# Patient Record
Sex: Female | Born: 1945 | Race: Black or African American | Hispanic: No | Marital: Single | Smoking: Former smoker
Health system: Southern US, Community
[De-identification: ages and names within clinical notes are randomized; demographics above are authoritative.]

## PROBLEM LIST (undated history)

## (undated) DIAGNOSIS — I82409 Acute embolism and thrombosis of unspecified deep veins of unspecified lower extremity: Secondary | ICD-10-CM

## (undated) DIAGNOSIS — C189 Malignant neoplasm of colon, unspecified: Secondary | ICD-10-CM

## (undated) DIAGNOSIS — C801 Malignant (primary) neoplasm, unspecified: Secondary | ICD-10-CM

## (undated) DIAGNOSIS — I10 Essential (primary) hypertension: Secondary | ICD-10-CM

---

## 2007-02-13 HISTORY — PX: OTHER SURGICAL HISTORY: SHX169

## 2014-05-08 ENCOUNTER — Emergency Department (HOSPITAL_COMMUNITY): Payer: Medicare Other

## 2014-05-08 ENCOUNTER — Encounter (HOSPITAL_COMMUNITY): Payer: Self-pay | Admitting: Oncology

## 2014-05-08 ENCOUNTER — Inpatient Hospital Stay (HOSPITAL_COMMUNITY)
Admission: EM | Admit: 2014-05-08 | Discharge: 2014-05-14 | DRG: 175 | Disposition: A | Discharge status: Home / Self Care | Payer: Medicare Other | Attending: Internal Medicine | Admitting: Internal Medicine

## 2014-05-08 DIAGNOSIS — Z87891 Personal history of nicotine dependence: Secondary | ICD-10-CM | POA: Diagnosis not present

## 2014-05-08 DIAGNOSIS — Z86711 Personal history of pulmonary embolism: Secondary | ICD-10-CM | POA: Diagnosis not present

## 2014-05-08 DIAGNOSIS — I2699 Other pulmonary embolism without acute cor pulmonale: Secondary | ICD-10-CM | POA: Diagnosis not present

## 2014-05-08 DIAGNOSIS — Z85118 Personal history of other malignant neoplasm of bronchus and lung: Secondary | ICD-10-CM | POA: Diagnosis not present

## 2014-05-08 DIAGNOSIS — E860 Dehydration: Secondary | ICD-10-CM | POA: Diagnosis present

## 2014-05-08 DIAGNOSIS — R079 Chest pain, unspecified: Secondary | ICD-10-CM

## 2014-05-08 DIAGNOSIS — D72829 Elevated white blood cell count, unspecified: Secondary | ICD-10-CM | POA: Diagnosis present

## 2014-05-08 DIAGNOSIS — N179 Acute kidney failure, unspecified: Secondary | ICD-10-CM | POA: Diagnosis present

## 2014-05-08 DIAGNOSIS — J9621 Acute and chronic respiratory failure with hypoxia: Secondary | ICD-10-CM | POA: Diagnosis not present

## 2014-05-08 DIAGNOSIS — Z85038 Personal history of other malignant neoplasm of large intestine: Secondary | ICD-10-CM | POA: Diagnosis not present

## 2014-05-08 DIAGNOSIS — R0902 Hypoxemia: Secondary | ICD-10-CM

## 2014-05-08 DIAGNOSIS — N189 Chronic kidney disease, unspecified: Secondary | ICD-10-CM | POA: Diagnosis present

## 2014-05-08 DIAGNOSIS — I82402 Acute embolism and thrombosis of unspecified deep veins of left lower extremity: Secondary | ICD-10-CM | POA: Diagnosis present

## 2014-05-08 DIAGNOSIS — E8770 Fluid overload, unspecified: Secondary | ICD-10-CM | POA: Diagnosis present

## 2014-05-08 DIAGNOSIS — Z86718 Personal history of other venous thrombosis and embolism: Secondary | ICD-10-CM | POA: Diagnosis not present

## 2014-05-08 DIAGNOSIS — I129 Hypertensive chronic kidney disease with stage 1 through stage 4 chronic kidney disease, or unspecified chronic kidney disease: Secondary | ICD-10-CM | POA: Diagnosis present

## 2014-05-08 DIAGNOSIS — R0789 Other chest pain: Secondary | ICD-10-CM

## 2014-05-08 DIAGNOSIS — D6959 Other secondary thrombocytopenia: Secondary | ICD-10-CM | POA: Diagnosis present

## 2014-05-08 DIAGNOSIS — G8929 Other chronic pain: Secondary | ICD-10-CM

## 2014-05-08 DIAGNOSIS — J189 Pneumonia, unspecified organism: Secondary | ICD-10-CM | POA: Diagnosis present

## 2014-05-08 HISTORY — DX: Acute embolism and thrombosis of unspecified deep veins of unspecified lower extremity: I82.409

## 2014-05-08 HISTORY — DX: Malignant (primary) neoplasm, unspecified: C80.1

## 2014-05-08 HISTORY — DX: Malignant neoplasm of colon, unspecified: C18.9

## 2014-05-08 HISTORY — DX: Essential (primary) hypertension: I10

## 2014-05-08 LAB — CBC WITH DIFFERENTIAL/PLATELET
BASOS ABS: 0 10*3/uL (ref 0.0–0.1)
Basophils Relative: 0 % (ref 0–1)
EOS ABS: 0 10*3/uL (ref 0.0–0.7)
EOS PCT: 0 % (ref 0–5)
HCT: 42 % (ref 36.0–46.0)
Hemoglobin: 13.5 g/dL (ref 12.0–15.0)
LYMPHS PCT: 7 % — AB (ref 12–46)
Lymphs Abs: 1.3 10*3/uL (ref 0.7–4.0)
MCH: 23.8 pg — ABNORMAL LOW (ref 26.0–34.0)
MCHC: 32.1 g/dL (ref 30.0–36.0)
MCV: 74.1 fL — ABNORMAL LOW (ref 78.0–100.0)
Monocytes Absolute: 0.9 10*3/uL (ref 0.1–1.0)
Monocytes Relative: 5 % (ref 3–12)
NEUTROS ABS: 15.9 10*3/uL — AB (ref 1.7–7.7)
Neutrophils Relative %: 88 % — ABNORMAL HIGH (ref 43–77)
PLATELETS: 153 10*3/uL (ref 150–400)
RBC: 5.67 MIL/uL — ABNORMAL HIGH (ref 3.87–5.11)
RDW: 16.1 % — AB (ref 11.5–15.5)
WBC: 18 10*3/uL — ABNORMAL HIGH (ref 4.0–10.5)

## 2014-05-08 LAB — BLOOD GAS, ARTERIAL
Acid-base deficit: 0.4 mmol/L (ref 0.0–2.0)
BICARBONATE: 24.1 meq/L — AB (ref 20.0–24.0)
Drawn by: 31814
FIO2: 1 %
O2 Saturation: 97.3 %
PO2 ART: 108 mmHg — AB (ref 80.0–100.0)
Patient temperature: 98.3
TCO2: 21.7 mmol/L (ref 0–100)
pCO2 arterial: 41.1 mmHg (ref 35.0–45.0)
pH, Arterial: 7.386 (ref 7.350–7.450)

## 2014-05-08 LAB — I-STAT TROPONIN, ED: TROPONIN I, POC: 0 ng/mL (ref 0.00–0.08)

## 2014-05-08 LAB — COMPREHENSIVE METABOLIC PANEL
ALT: 31 U/L (ref 0–35)
AST: 44 U/L — ABNORMAL HIGH (ref 0–37)
Albumin: 3.5 g/dL (ref 3.5–5.2)
Alkaline Phosphatase: 81 U/L (ref 39–117)
Anion gap: 14 (ref 5–15)
BUN: 30 mg/dL — ABNORMAL HIGH (ref 6–23)
CALCIUM: 9.2 mg/dL (ref 8.4–10.5)
CHLORIDE: 104 mmol/L (ref 96–112)
CO2: 21 mmol/L (ref 19–32)
Creatinine, Ser: 1.57 mg/dL — ABNORMAL HIGH (ref 0.50–1.10)
GFR calc non Af Amer: 32 mL/min — ABNORMAL LOW (ref >90)
GFR, EST AFRICAN AMERICAN: 37 mL/min — AB (ref >90)
GLUCOSE: 104 mg/dL — AB (ref 70–99)
POTASSIUM: 4.4 mmol/L (ref 3.5–5.1)
SODIUM: 139 mmol/L (ref 135–145)
Total Bilirubin: 2.5 mg/dL — ABNORMAL HIGH (ref 0.3–1.2)
Total Protein: 7.4 g/dL (ref 6.0–8.3)

## 2014-05-08 LAB — BRAIN NATRIURETIC PEPTIDE: B Natriuretic Peptide: 101.7 pg/mL — ABNORMAL HIGH (ref 0.0–100.0)

## 2014-05-08 MED ORDER — PANTOPRAZOLE SODIUM 40 MG PO TBEC
40.0000 mg | DELAYED_RELEASE_TABLET | Freq: Every day | ORAL | Status: DC
  Administered 2014-05-09 – 2014-05-14 (×6): 40 mg via ORAL
  Filled 2014-05-08 (×6): qty 1

## 2014-05-08 MED ORDER — ACETAMINOPHEN 500 MG PO TABS
1000.0000 mg | ORAL_TABLET | Freq: Four times a day (QID) | ORAL | Status: DC | PRN
  Administered 2014-05-09: 1000 mg via ORAL
  Filled 2014-05-08: qty 2

## 2014-05-08 MED ORDER — ACETAMINOPHEN-CODEINE #4 300-60 MG PO TABS: 1.0000 | ORAL_TABLET | ORAL | Status: DC | PRN

## 2014-05-08 MED ORDER — TIOTROPIUM BROMIDE MONOHYDRATE 18 MCG IN CAPS
18.0000 ug | ORAL_CAPSULE | Freq: Every day | RESPIRATORY_TRACT | Status: DC
  Administered 2014-05-09 – 2014-05-13 (×5): 18 ug via RESPIRATORY_TRACT
  Filled 2014-05-08: qty 5

## 2014-05-08 MED ORDER — AZITHROMYCIN 500 MG PO TABS
500.0000 mg | ORAL_TABLET | ORAL | Status: DC
  Administered 2014-05-09 – 2014-05-13 (×5): 500 mg via ORAL
  Filled 2014-05-08 (×6): qty 1

## 2014-05-08 MED ORDER — DEXTROSE 5 % IV SOLN
1.0000 g | Freq: Once | INTRAVENOUS | Status: AC
  Administered 2014-05-08: 1 g via INTRAVENOUS
  Filled 2014-05-08: qty 10

## 2014-05-08 MED ORDER — METOPROLOL TARTRATE 25 MG PO TABS: 25.0000 mg | ORAL_TABLET | Freq: Every day | ORAL | Status: DC

## 2014-05-08 MED ORDER — CYCLOSPORINE 0.05 % OP EMUL
1.0000 [drp] | Freq: Two times a day (BID) | OPHTHALMIC | Status: DC
  Administered 2014-05-09 – 2014-05-14 (×12): 1 [drp] via OPHTHALMIC
  Filled 2014-05-08 (×14): qty 1

## 2014-05-08 MED ORDER — PREGABALIN 75 MG PO CAPS
300.0000 mg | ORAL_CAPSULE | Freq: Every day | ORAL | Status: DC
  Administered 2014-05-09 – 2014-05-13 (×6): 300 mg via ORAL
  Filled 2014-05-08: qty 6
  Filled 2014-05-08 (×5): qty 4

## 2014-05-08 MED ORDER — SIMETHICONE 80 MG PO CHEW
80.0000 mg | CHEWABLE_TABLET | Freq: Four times a day (QID) | ORAL | Status: DC | PRN
  Filled 2014-05-08: qty 1

## 2014-05-08 MED ORDER — HEPARIN BOLUS VIA INFUSION
2500.0000 [IU] | Freq: Once | INTRAVENOUS | Status: AC
  Administered 2014-05-08: 2500 [IU] via INTRAVENOUS
  Filled 2014-05-08: qty 2500

## 2014-05-08 MED ORDER — IOHEXOL 350 MG/ML SOLN
100.0000 mL | Freq: Once | INTRAVENOUS | Status: AC | PRN
  Administered 2014-05-08: 100 mL via INTRAVENOUS

## 2014-05-08 MED ORDER — ALBUTEROL SULFATE HFA 108 (90 BASE) MCG/ACT IN AERS: 2.0000 | INHALATION_SPRAY | Freq: Four times a day (QID) | RESPIRATORY_TRACT | Status: DC | PRN

## 2014-05-08 MED ORDER — MOMETASONE FURO-FORMOTEROL FUM 100-5 MCG/ACT IN AERO
2.0000 | INHALATION_SPRAY | Freq: Two times a day (BID) | RESPIRATORY_TRACT | Status: DC
  Administered 2014-05-09 – 2014-05-14 (×12): 2 via RESPIRATORY_TRACT
  Filled 2014-05-08: qty 8.8

## 2014-05-08 MED ORDER — DEXTROSE 5 % IV SOLN
500.0000 mg | Freq: Once | INTRAVENOUS | Status: AC
  Administered 2014-05-09: 500 mg via INTRAVENOUS
  Filled 2014-05-08: qty 500

## 2014-05-08 MED ORDER — ADULT MULTIVITAMIN W/MINERALS CH
1.0000 | ORAL_TABLET | Freq: Every day | ORAL | Status: DC
  Administered 2014-05-09 – 2014-05-14 (×7): 1 via ORAL
  Filled 2014-05-08 (×7): qty 1

## 2014-05-08 MED ORDER — HYDROXYCHLOROQUINE SULFATE 200 MG PO TABS
200.0000 mg | ORAL_TABLET | Freq: Every day | ORAL | Status: DC
  Administered 2014-05-09 – 2014-05-14 (×7): 200 mg via ORAL
  Filled 2014-05-08 (×7): qty 1

## 2014-05-08 MED ORDER — DEXTROSE 5 % IV SOLN
1.0000 g | INTRAVENOUS | Status: DC
  Administered 2014-05-09 – 2014-05-11 (×3): 1 g via INTRAVENOUS
  Filled 2014-05-08 (×3): qty 10

## 2014-05-08 MED ORDER — HEPARIN (PORCINE) IN NACL 100-0.45 UNIT/ML-% IJ SOLN
1800.0000 [IU]/h | INTRAMUSCULAR | Status: DC
  Administered 2014-05-08: 1350 [IU]/h via INTRAVENOUS
  Administered 2014-05-09: 1500 [IU]/h via INTRAVENOUS
  Administered 2014-05-10: 1800 [IU]/h via INTRAVENOUS
  Filled 2014-05-08 (×3): qty 250

## 2014-05-08 MED ORDER — DILTIAZEM HCL ER 120 MG PO CP24: 120.0000 mg | ORAL_CAPSULE | Freq: Every day | ORAL | Status: DC

## 2014-05-08 NOTE — ED Notes (Signed)
Hospitalist MD at bedside. 

## 2014-05-08 NOTE — H&P (Signed)
[facility] History and Physical  Erin Park YTK:[id number] DOB: 1945-08-19 DOA: 05/08/2014  Referring physician: EDP PCP: No primary care provider on file.   Chief Complaint: Chest pain   HPI: Erin Park is a 69 y.o. female with h/o lung CA in past s/p RLL lobectomy, h/o DVT had been on anticoagulation that was stopped several months ago due to vaginal bleeding (was worked up by OB/GYN, not endometrial carcinoma patient states, had D&C).  Patient presents to ED after developing R sided chest pain, SOB.  Symptoms have been intermittent since 6:30 AM today.  She also has LLE leg and calf pain and a new "knot" on her LLE shin.  All of this occurs in the context of prolonged immobility (train ride from [place] to [place] today).  She has also had nausea, 2 episodes of vomiting, felt febrile but never took temperature.  Not really much of a cough she states.  Review of Systems: Systems reviewed.  As above, otherwise negative  Past Medical History  Diagnosis Date  . Hypertension   . Cancer     Lung CA s/p right lowerlobectomy  . DVT (deep venous thrombosis)    Past Surgical History  Procedure Laterality Date  . Lobectomy Right 2009   Social History:  reports that she has quit smoking. She does not have any smokeless tobacco history on file. She reports that she drinks alcohol. She reports that she does not use illicit drugs.  No Known Allergies  History reviewed. No pertinent family history.   Prior to Admission medications   Medication Sig Start Date End Date Taking? Authorizing Provider  acetaminophen (TYLENOL) 500 MG tablet Take 1,000 mg by mouth every 6 (six) hours as needed for moderate pain.   Yes Historical Provider, MD  acetaminophen-codeine (TYLENOL #4) 300-60 MG per tablet Take 1 tablet by mouth every 4 (four) hours as needed for moderate pain.   Yes Historical Provider, MD  albuterol (PROVENTIL HFA;VENTOLIN HFA) 108 (90 BASE) MCG/ACT inhaler Inhale 2 puffs into the  lungs every 6 (six) hours as needed for wheezing or shortness of breath.   Yes Historical Provider, MD  cycloSPORINE (RESTASIS) 0.05 % ophthalmic emulsion Place 1 drop into both eyes 2 (two) times daily.   Yes Historical Provider, MD  diltiazem (DILACOR XR) 120 MG 24 hr capsule Take 120 mg by mouth daily.   Yes Historical Provider, MD  Fluticasone-Salmeterol (ADVAIR) 250-50 MCG/DOSE AEPB Inhale 1 puff into the lungs 2 (two) times daily.   Yes Historical Provider, MD  hydroxychloroquine (PLAQUENIL) 200 MG tablet Take 200 mg by mouth daily.   Yes Historical Provider, MD  metoprolol tartrate (LOPRESSOR) 25 MG tablet Take 25 mg by mouth daily.   Yes Historical Provider, MD  Multiple Vitamins-Minerals (MULTIVITAMINS THER. W/MINERALS) TABS tablet Take 1 tablet by mouth daily.   Yes Historical Provider, MD  omeprazole (PRILOSEC) 20 MG capsule Take 20 mg by mouth daily.   Yes Historical Provider, MD  pregabalin (LYRICA) 150 MG capsule Take 300 mg by mouth at bedtime.   Yes Historical Provider, MD  simethicone (MYLICON) 751 MG chewable tablet Chew 250 mg by mouth every 6 (six) hours as needed for flatulence.   Yes Historical Provider, MD  spironolactone-hydrochlorothiazide (ALDACTAZIDE) 25-25 MG per tablet Take 1 tablet by mouth daily.   Yes Historical Provider, MD  tiotropium (SPIRIVA) 18 MCG inhalation capsule Place 18 mcg into inhaler and inhale daily.   Yes Historical Provider, MD   Physical Exam: Filed Vitals:  05/08/14 2345  BP: 119/71  Pulse: 81  Temp:   Resp: 21    BP 119/71 mmHg  Pulse 81  Temp(Src) 98.3 F (36.8 C) (Oral)  Resp 21  Ht 5\' 2"  (1.575 m)  Wt 111.131 kg (245 lb)  BMI 44.80 kg/m2  SpO2 94%  General Appearance:    Alert, oriented, no distress, appears stated age  Head:    Normocephalic, atraumatic  Eyes:    PERRL, EOMI, sclera non-icteric        Nose:   Nares without drainage or epistaxis. Mucosa, turbinates normal  Throat:   Moist mucous membranes. Oropharynx without  erythema or exudate.  Neck:   Supple. No carotid bruits.  No thyromegaly.  No lymphadenopathy.   Back:     No CVA tenderness, no spinal tenderness  Lungs:     Clear to auscultation bilaterally, without wheezes, rhonchi or rales  Chest wall:    No tenderness to palpitation  Heart:    Regular rate and rhythm without murmurs, gallops, rubs  Abdomen:     Soft, non-tender, nondistended, normal bowel sounds, no organomegaly  Genitalia:    deferred  Rectal:    deferred  Extremities:   No clubbing, cyanosis or edema.  Pulses:   2+ and symmetric all extremities  Skin:   Has LLE swelling, LLE has small "knot like" erythematous area on anterior shin suspicious for SVT  Lymph nodes:   Cervical, supraclavicular, and axillary nodes normal  Neurologic:   CNII-XII intact. Normal strength, sensation and reflexes      throughout    Labs on Admission:  Basic Metabolic Panel:  Recent Labs Lab 05/08/14 2033  NA 139  K 4.4  CL 104  CO2 21  GLUCOSE 104*  BUN 30*  CREATININE 1.57*  CALCIUM 9.2   Liver Function Tests:  Recent Labs Lab 05/08/14 2033  AST 44*  ALT 31  ALKPHOS 81  BILITOT 2.5*  PROT 7.4  ALBUMIN 3.5   No results for input(s): LIPASE, AMYLASE in the last 168 hours. No results for input(s): AMMONIA in the last 168 hours. CBC:  Recent Labs Lab 05/08/14 2033  WBC 18.0*  NEUTROABS 15.9*  HGB 13.5  HCT 42.0  MCV 74.1*  PLT 153   Cardiac Enzymes: No results for input(s): CKTOTAL, CKMB, CKMBINDEX, TROPONINI in the last 168 hours.  BNP (last 3 results) No results for input(s): PROBNP in the last 8760 hours. CBG: No results for input(s): GLUCAP in the last 168 hours.  Radiological Exams on Admission: Ct Angio Chest Pe W/cm &/or Wo Cm  05/08/2014   CLINICAL DATA:  Right side chest pain radiating to back. History of right lobectomy in 2009.  EXAM: CT ANGIOGRAPHY CHEST WITH CONTRAST  TECHNIQUE: Multidetector CT imaging of the chest was performed using the standard protocol  during bolus administration of intravenous contrast. Multiplanar CT image reconstructions and MIPs were obtained to evaluate the vascular anatomy.  CONTRAST:  115mL OMNIPAQUE IOHEXOL 350 MG/ML SOLN  COMPARISON:  None.  FINDINGS: Filling defects are noted within left upper lobe pulmonary arteries and right lower lung compatible with pulmonary emboli. Heart is normal size. Aorta is normal caliber. No mediastinal, hilar, or axillary adenopathy.  Bilateral airspace opacities are noted, most confluent in the bases, also noted in the right middle lobe. Prior postoperative changes from right lower lobectomy. No pleural effusions.  Chest wall soft tissues are unremarkable. Imaging into the upper abdomen shows no acute findings.  Review of the MIP images  confirms the above findings.  IMPRESSION: Left upper lobe and right lower lung pulmonary emboli.  Bilateral airspace opacities, most confluent in the bases. Cannot exclude pneumonia.  Critical Value/emergent results were called by telephone at the time of interpretation on 05/08/2014 at 10:51 pm to Dr. Hyman Bible , who verbally acknowledged these results.   Electronically Signed   By: Rolm Baptise M.D.   On: 05/08/2014 22:53    EKG: Independently reviewed.  Assessment/Plan Active Problems:   CAP (community acquired pneumonia)   Pulmonary embolism   Acute on chronic respiratory failure with hypoxia   Left leg DVT   Leukocytosis   1. Acute on chronic respiratory failure with hypoxia - 2L at baseline at home, now with increased O2 requirements. 1. Multifactorial 2. Definite PE is present 3. Also cannot rule out CAP given: the CT findings, the leukocytosis of 18k, and the degree of new hypoxia is impressive when compared to how small the PEs are on CT. 4. Continuous pulse ox and tele monitor 2. PE - Definite PE seen on CT scan 1. Probably from a LLE DVT that is provoked by todays long train ride 2. Venous duplex of BLE 3. Heparin gtt 4. Watch for signs  of recurrent vaginal bleeding (which she hasnt had for months since being taken off of blood thinners). 3. CAP - 1. PNA pathway 2. Rocephin and azithromycin 3. Cultures pending 4. Leukocytosis - repeat CBC in AM    Code Status: Full Code  Family Communication: Family at bedside Disposition Plan: Admit to inpatient   Time spent: 70 min  GARDNER, JARED M. [facility] Pager [phone]  If 7AM-7PM, please contact the day team taking care of the patient [email or web address] Password [facility] 05/08/2014, 11:58 PM

## 2014-05-08 NOTE — ED Provider Notes (Signed)
CSN: [id number]     Arrival date & time 05/08/14  1947 History   First MD Initiated Contact with Patient 05/08/14 1958     Chief Complaint  Patient presents with  . Chest Pain     (Consider location/radiation/quality/duration/timing/severity/associated sxs/prior Treatment) HPI Comments: Patient with a history of lung CA s/p RLL lobectomy, DVT, and HTN presents today with right sided chest pain.  She reports that the pain has been intermittent since 6:30 AM this morning.  Pain located right anterior chest just inferior to the right breast and does not radiate.  She reports that the pain is worse with movement.  She took Tylenol for the pain, but does not feel that it helped.  She is from [place] and rode a train to [employer] today.  She reports that the pain is associated with SOB, occasional cough, nausea, two episodes of vomiting, and diaphoresis.  She states that she felt febrile, but never took her temperature.  She denies dizziness, syncope, or hemoptysis.  She also reports that on the train she began having pain of the left shin and noticed a knot on her left shin.  She reports a history of DVT and states that she has been on Xarelto in the past.  She states that she was taken off of the Xarelto seven months ago due to vaginal bleeding.  She is currently not on any anticoagulants at this time.  She denies prior cardiac history.  She has a history of HTN.  No history of Hyperlipidemia or DM.  She reports that she quit smoking in the 1970's.    The history is provided by the patient.    No past medical history on file. No past surgical history on file. No family history on file. History  Substance Use Topics  . Smoking status: Not on file  . Smokeless tobacco: Not on file  . Alcohol Use: Not on file   OB History    No data available     Review of Systems  All other systems reviewed and are negative.     Allergies  Review of patient's allergies indicates not on  file.  Home Medications   Prior to Admission medications   Not on File   BP 101/57 mmHg  Pulse 88  Temp(Src) 98.3 F (36.8 C) (Oral)  Resp 21  SpO2 87% Physical Exam  Constitutional: She appears well-developed and well-nourished.  HENT:  Head: Normocephalic and atraumatic.  Mouth/Throat: Oropharynx is clear and moist.  Neck: Normal range of motion. Neck supple.  Cardiovascular: Normal rate, regular rhythm and normal heart sounds.   Pulses:      Dorsalis pedis pulses are 2+ on the right side, and 2+ on the left side.  Pulmonary/Chest: Effort normal and breath sounds normal. No respiratory distress. She has no wheezes. She has no rales. She exhibits tenderness.  Abdominal: Soft. Bowel sounds are normal. She exhibits no distension and no mass. There is no tenderness. There is no rebound and no guarding.  Musculoskeletal: Normal range of motion.  Small tender erythematous area of the left shin.  No surrounding edema or warmth  Neurological: She is alert.  Skin: Skin is warm and dry.  Psychiatric: She has a normal mood and affect.  Nursing note and vitals reviewed.   ED Course  Procedures (including critical care time) Labs Review Labs Reviewed - No data to display  Imaging Review No results found.   EKG Interpretation None  MDM   Final diagnoses:  None   Patient presents today with SOB and right sided chest pain.  She has a history of DVT, but was taken off of anticoagulation approximately 7 months ago due to vaginal bleeding.  She rode a train to [place] from [place] today.  CT angio chest showing PE and also possibly CAP.  Patient started on Heparin to treat PE and also Ceftriaxone and Azithromycin to treat possible CAP.  No ischemic changes on EKG.  Troponin negative.  Patient admitted to [facility] for further management of PE and CAP.    Hyman Bible, PA-C 05/10/14 2148  Evelina Bucy, MD 05/10/14 [phone]

## 2014-05-08 NOTE — ED Notes (Signed)
12 lead given to Dr. Mingo Amber for review

## 2014-05-08 NOTE — Progress Notes (Signed)
ANTICOAGULATION CONSULT NOTE - Initial Consult  Pharmacy Consult for Heparin Indication: pulmonary embolus  No Known Allergies  Patient Measurements: Height: 5\' 2"  (157.5 cm) Weight: 245 lb (111.131 kg) IBW/kg (Calculated) : 50.1 Heparin Dosing Weight: 76.7 kg   Vital Signs: Temp: 98.3 F (36.8 C) (03/26 2007) Temp Source: Oral (03/26 2007) BP: 104/68 mmHg (03/26 2215) Pulse Rate: 81 (03/26 2215)  Labs:  Recent Labs  05/08/14 2033  HGB 13.5  HCT 42.0  PLT 153  CREATININE 1.57*    Estimated Creatinine Clearance: 38.7 mL/min (by C-G formula based on Cr of 1.57).   Medical History: Past Medical History  Diagnosis Date  . Hypertension   . Cancer     Lung CA s/p right lowerlobectomy    Medications:  Scheduled:   Infusions:  . azithromycin    . cefTRIAXone (ROCEPHIN)  IV    . heparin     Followed by  . heparin      Assessment:  69 yr female travelling via train and developed chest pain, lethargy.  H/O lobectomy.  H&P not available at this time  PTA medications reviewed and patient not on any oral anticoagulants  CTAngio shows + left upper lobe and right lower lung PE  Pharmacy consulted to dose IV Heparin for +PE  Use [place] nomogram for heparin dosing  Goal of Therapy:  Heparin level 0.3-0.7 units/ml Monitor platelets by anticoagulation protocol: Yes   Plan:   Obtain baseline PTT and PT/INR stat  Heparin 2500 unit IV bolus x 1 followed by heparin gtt @ 1350 units/hr  Check heparin level 8 hr after heparin started  Follow heparin level & CBC daily  Everette Rank, PharmD 05/08/2014,11:06 PM

## 2014-05-08 NOTE — ED Notes (Signed)
Per pt she was on the train and began to have right sided chest pain that wraps around to her back.  Pt rates pain 8/10 stabbing in nature.  Pt has hx of right lobectomy in 2009.  Per family pt has been lethargic throughout the train ride from [place] to here.

## 2014-05-09 ENCOUNTER — Encounter (HOSPITAL_COMMUNITY): Payer: Self-pay

## 2014-05-09 DIAGNOSIS — D72829 Elevated white blood cell count, unspecified: Secondary | ICD-10-CM | POA: Diagnosis present

## 2014-05-09 DIAGNOSIS — N189 Chronic kidney disease, unspecified: Secondary | ICD-10-CM

## 2014-05-09 DIAGNOSIS — N179 Acute kidney failure, unspecified: Secondary | ICD-10-CM

## 2014-05-09 LAB — CBC
HCT: 39.6 % (ref 36.0–46.0)
HEMOGLOBIN: 12.9 g/dL (ref 12.0–15.0)
MCH: 24 pg — ABNORMAL LOW (ref 26.0–34.0)
MCHC: 32.6 g/dL (ref 30.0–36.0)
MCV: 73.7 fL — ABNORMAL LOW (ref 78.0–100.0)
Platelets: 108 10*3/uL — ABNORMAL LOW (ref 150–400)
RBC: 5.37 MIL/uL — ABNORMAL HIGH (ref 3.87–5.11)
RDW: 16.3 % — AB (ref 11.5–15.5)
WBC: 17.7 10*3/uL — AB (ref 4.0–10.5)

## 2014-05-09 LAB — EXPECTORATED SPUTUM ASSESSMENT W REFEX TO RESP CULTURE

## 2014-05-09 LAB — BASIC METABOLIC PANEL
ANION GAP: 13 (ref 5–15)
BUN: 26 mg/dL — ABNORMAL HIGH (ref 6–23)
CHLORIDE: 104 mmol/L (ref 96–112)
CO2: 23 mmol/L (ref 19–32)
Calcium: 8.8 mg/dL (ref 8.4–10.5)
Creatinine, Ser: 1.32 mg/dL — ABNORMAL HIGH (ref 0.50–1.10)
GFR, EST AFRICAN AMERICAN: 46 mL/min — AB (ref >90)
GFR, EST NON AFRICAN AMERICAN: 40 mL/min — AB (ref >90)
GLUCOSE: 93 mg/dL (ref 70–99)
POTASSIUM: 4.3 mmol/L (ref 3.5–5.1)
Sodium: 140 mmol/L (ref 135–145)

## 2014-05-09 LAB — EXPECTORATED SPUTUM ASSESSMENT W GRAM STAIN, RFLX TO RESP C

## 2014-05-09 LAB — LACTIC ACID, PLASMA
LACTIC ACID, VENOUS: 3.8 mmol/L — AB (ref 0.5–2.0)
LACTIC ACID, VENOUS: 2.7 mmol/L — AB (ref 0.5–2.0)

## 2014-05-09 LAB — I-STAT CG4 LACTIC ACID, ED: LACTIC ACID, VENOUS: 1.16 mmol/L (ref 0.5–2.0)

## 2014-05-09 LAB — HEPARIN LEVEL (UNFRACTIONATED)
Heparin Unfractionated: 0.26 IU/mL — ABNORMAL LOW (ref 0.30–0.70)
Heparin Unfractionated: 0.2 IU/mL — ABNORMAL LOW (ref 0.30–0.70)

## 2014-05-09 LAB — PROTIME-INR
INR: 1.36 (ref 0.00–1.49)
Prothrombin Time: 16.9 seconds — ABNORMAL HIGH (ref 11.6–15.2)

## 2014-05-09 LAB — STREP PNEUMONIAE URINARY ANTIGEN: Strep Pneumo Urinary Antigen: NEGATIVE

## 2014-05-09 LAB — APTT: aPTT: 25 seconds (ref 24–37)

## 2014-05-09 MED ORDER — HEPARIN BOLUS VIA INFUSION
1150.0000 [IU] | Freq: Once | INTRAVENOUS | Status: AC
  Administered 2014-05-09: 1150 [IU] via INTRAVENOUS
  Filled 2014-05-09: qty 1150

## 2014-05-09 MED ORDER — COUMADIN BOOK
Freq: Once | Status: AC
  Administered 2014-05-09: 12:00:00
  Filled 2014-05-09 (×2): qty 1

## 2014-05-09 MED ORDER — WARFARIN - PHARMACIST DOSING INPATIENT: Freq: Every day | Status: DC

## 2014-05-09 MED ORDER — SODIUM CHLORIDE 0.9 % IV SOLN
INTRAVENOUS | Status: DC
  Administered 2014-05-09: 12:00:00 via INTRAVENOUS

## 2014-05-09 MED ORDER — WARFARIN SODIUM 5 MG PO TABS
7.5000 mg | ORAL_TABLET | Freq: Once | ORAL | Status: AC
  Administered 2014-05-09: 7.5 mg via ORAL
  Filled 2014-05-09 (×2): qty 1

## 2014-05-09 MED ORDER — ACETAMINOPHEN 325 MG PO TABS: 650.0000 mg | ORAL_TABLET | Freq: Four times a day (QID) | ORAL | Status: DC | PRN

## 2014-05-09 MED ORDER — ACETAMINOPHEN-CODEINE #4 300-60 MG PO TABS
1.0000 | ORAL_TABLET | Freq: Three times a day (TID) | ORAL | Status: DC | PRN
  Administered 2014-05-09 – 2014-05-12 (×6): 1 via ORAL
  Filled 2014-05-09 (×6): qty 1

## 2014-05-09 MED ORDER — ALBUTEROL SULFATE (2.5 MG/3ML) 0.083% IN NEBU: 2.5000 mg | INHALATION_SOLUTION | Freq: Four times a day (QID) | RESPIRATORY_TRACT | Status: DC | PRN

## 2014-05-09 NOTE — Progress Notes (Signed)
ANTICOAGULATION CONSULT NOTE -   Pharmacy Consult for Heparin/warfarin Indication: pulmonary embolus  No Known Allergies  Patient Measurements: Height: 5\' 2"  (157.5 cm) Weight: 252 lb (114.306 kg) IBW/kg (Calculated) : 50.1 Heparin Dosing Weight: 76.7 kg   Vital Signs: Temp: 98.7 F (37.1 C) (03/27 1454) Temp Source: Oral (03/27 1454) BP: 112/54 mmHg (03/27 1454) Pulse Rate: 93 (03/27 1454)  Labs:  Recent Labs  05/08/14 2033 05/08/14 2318 05/09/14 0532 05/09/14 0947 05/09/14 1900  HGB 13.5  --  12.9  --   --   HCT 42.0  --  39.6  --   --   PLT 153  --  108*  --   --   APTT  --  25  --   --   --   LABPROT  --  16.9*  --   --   --   INR  --  1.36  --   --   --   HEPARINUNFRC  --  <0.10*  --  0.26* 0.20*  CREATININE 1.57*  --  1.32*  --   --     Estimated Creatinine Clearance: 46.8 mL/min (by C-G formula based on Cr of 1.32).   Medical History: Past Medical History  Diagnosis Date  . Hypertension   . DVT (deep venous thrombosis)   . Cancer     Lung CA s/p right lowerlobectomy  . Colon cancer     Medications:  Scheduled:  . azithromycin  500 mg Oral Q24H  . cefTRIAXone (ROCEPHIN)  IV  1 g Intravenous Q24H  . cycloSPORINE  1 drop Both Eyes BID  . hydroxychloroquine  200 mg Oral Daily  . mometasone-formoterol  2 puff Inhalation BID  . multivitamin with minerals  1 tablet Oral Daily  . pantoprazole  40 mg Oral Daily  . pregabalin  300 mg Oral QHS  . tiotropium  18 mcg Inhalation Daily  . Warfarin - Pharmacist Dosing Inpatient   Does not apply q1800   Infusions:  . sodium chloride 75 mL/hr at 05/09/14 1158  . heparin 1,500 Units/hr (05/09/14 1418)    Assessment:  69 yr female travelling via train and developed chest pain, lethargy.  H/O lobectomy.  H&P not available at this time  PTA medications reviewed and patient not on any oral anticoagulants  CTAngio shows + left upper lobe and right lower lung PE  Pharmacy consulted to dose IV  Heparin/warfarin for +PE  Use [place] nomogram for heparin dosing  Today 3/27  2nd heparin level 0.2 (subtherapeutic) @ 1500 Units/hr  No bleeding per RN  INR 1.36, H/H WNL, plts down 108  Scr 1.32  Goal of Therapy:  Heparin level 0.3-0.7 units/ml Monitor platelets by anticoagulation protocol: Yes   Plan:   Heparin 1150 unit IV bolus x 1 and increase heparin drip to 1800 units/hr  Check heparin level 8 hr  Follow heparin level & CBC daily  Warfarin 7.5mg  x1 @ 1800  Daily INR  Pharmacy to provide warfarin education  Dolly Rias RPh 05/09/2014, 8:00 PM Pager [phone]

## 2014-05-09 NOTE — Progress Notes (Signed)
ANTICOAGULATION CONSULT NOTE -   Pharmacy Consult for Heparin/warfarin Indication: pulmonary embolus  No Known Allergies  Patient Measurements: Height: 5\' 2"  (157.5 cm) Weight: 252 lb (114.306 kg) IBW/kg (Calculated) : 50.1 Heparin Dosing Weight: 76.7 kg   Vital Signs: Temp: 98.4 F (36.9 C) (03/27 0806) Temp Source: Oral (03/27 0806) BP: 105/51 mmHg (03/27 0806) Pulse Rate: 91 (03/27 0806)  Labs:  Recent Labs  05/08/14 2033 05/08/14 2318 05/09/14 0532 05/09/14 0947  HGB 13.5  --  12.9  --   HCT 42.0  --  39.6  --   PLT 153  --  108*  --   APTT  --  25  --   --   LABPROT  --  16.9*  --   --   INR  --  1.36  --   --   HEPARINUNFRC  --  <0.10*  --  0.26*  CREATININE 1.57*  --  1.32*  --     Estimated Creatinine Clearance: 46.8 mL/min (by C-G formula based on Cr of 1.32).   Medical History: Past Medical History  Diagnosis Date  . Hypertension   . DVT (deep venous thrombosis)   . Cancer     Lung CA s/p right lowerlobectomy  . Colon cancer     Medications:  Scheduled:  . azithromycin  500 mg Oral Q24H  . cefTRIAXone (ROCEPHIN)  IV  1 g Intravenous Q24H  . cycloSPORINE  1 drop Both Eyes BID  . hydroxychloroquine  200 mg Oral Daily  . mometasone-formoterol  2 puff Inhalation BID  . multivitamin with minerals  1 tablet Oral Daily  . pantoprazole  40 mg Oral Daily  . pregabalin  300 mg Oral QHS  . tiotropium  18 mcg Inhalation Daily   Infusions:  . heparin 1,350 Units/hr (05/08/14 2341)    Assessment:  69 yr female travelling via train and developed chest pain, lethargy.  H/O lobectomy.  H&P not available at this time  PTA medications reviewed and patient not on any oral anticoagulants  CTAngio shows + left upper lobe and right lower lung PE  Pharmacy consulted to dose IV Heparin for +PE  Use [place] nomogram for heparin dosing  Today 3/27  1st heparin level 0.26 (subtherapeutic) @ 1350 Units/hr  Pharmacy consulted to dose warfarin  INR  1.36, H/H WNL, plts down 108  Scr 1.32  Goal of Therapy:  Heparin level 0.3-0.7 units/ml Monitor platelets by anticoagulation protocol: Yes   Plan:   Heparin 1150 unit IV bolus x 1 and increase heparin drip to 1500 units/hr  Check heparin level 8 hr  Follow heparin level & CBC daily  Warfarin 7.5mg  x1 @ 1800  Daily INR  Pharmacy to provide warfarin education  Dolly Rias RPh 05/09/2014, 11:18 AM Pager [phone]

## 2014-05-09 NOTE — Progress Notes (Addendum)
CRITICAL VALUE ALERT  Critical value received: 3.8  Date of notification:  3/27   Time of notification:  3329  Critical value read back:Yes.    Nurse who received alert:  Wess Botts  MD notified (1st page):  [place]  Time of first page:  [phone]  MD notified (2nd page):  Time of second page:  Responding MD:  Hongalgi  Time MD responded:  4166  Pt VSS. Fluids at [facility], pt states she has not been drinking very much since friday.  No pedal edema.  MD made aware.

## 2014-05-09 NOTE — Progress Notes (Signed)
CRITICAL VALUE ALERT  Critical value received:  Lactic acid  Date of notification:  05/09/14  Time of notification:  5830  Critical value read back:Yes.    Nurse who received alert:  Wess Botts  MD notified (1st page):  [place]gi  Time of first page:  1032  MD notified (2nd page):  Time of second page:  Responding MD:  Algis Liming  Time MD responded:  1032

## 2014-05-09 NOTE — Progress Notes (Signed)
VASCULAR LAB PRELIMINARY  PRELIMINARY  PRELIMINARY  PRELIMINARY  Bilateral lower extremity venous Dopplers completed.    Preliminary report:  There is no obvious evidence of DVT or SVT noted in the bilateral lower extremities.   Hendrix Yurkovich, RVT 05/09/2014, 10:53 AM

## 2014-05-09 NOTE — Progress Notes (Signed)
PROGRESS NOTE    Erin Park YNW:[id number] DOB: 11-Oct-1945 DOA: 05/08/2014 PCP: No primary care provider on file.  HPI/Brief narrative 69 year old female patient with history of Colon Ca  s/p surgery 2004 and lung cancer s/p RLL lobectomy 2009, never received chemotherapy or radiation, prior history of DVT and was on Coumadin from 2009 until 2015 when she was switched to Xarelto but started having vaginal bleeding issues at which time all anticoagulation was discontinued, chronic respiratory failure on 2 L/m home oxygen & HTN. Workup by OB/GYN apparently did not reveal a cause for vaginal bleeding. Patient lives in [place] and is visiting family in the [place] area. Prior to boarding her train at about 6:30 AM on 05/08/14, she was feeling unwell with nausea, vomiting and diarrhea. She progressively got worse on the train with complaints of right-sided chest pain, dyspnea, left lower extremity leg and calf pain. She felt feverish but did not check her temperatures. She is diagnosed with bilateral acute PE, possible community-acquired pneumonia and acute on chronic respiratory failure with hypoxia.   Assessment/Plan:  Bilateral acute pulmonary embolism LUL and RLL - Patient admitted to telemetry: Sinus rhythm - Started on IV heparin drip. - Bilateral lower extremity venous Dopplers are negative for DVT. - Patient had one episode of trace blood in sputum but no other overt or significant bleeding. - Discussed oral anticoagulants including Coumadin and NOAC's including risks, benefits and side effects. Patient opted to proceed with Coumadin. - Platelet count has dropped from 153 > 108. Monitor closely while on IV heparin. - Consider transitioning to Lovenox bridging and Coumadin in 1-2 days.  Possible community-acquired pneumonia - Continue IV Rocephin and azithromycin  Acute on chronic hypoxic respiratory failure - Precipitated by bilateral acute PE and possible CAP. - Continue oxygen  supplementation - On 2 L home O2. Currently on 6 L/m. Titrate down as tolerated.  Acute on chronic kidney disease - Likely secondary to GI losses, poor oral intake and dehydration - Improving. Continue gentle IV fluids and encourage oral intake.  Thrombocytopenia - Unsure etiology. - Follow CBC closely while on IV heparin.  Nausea, vomiting and diarrhea - Self-limited. - ? Secondary to acute viral illness - Has not reported any symptoms since admission. Monitor - Total bilirubin is elevated but other LFTs are unremarkable. Follow LFTs in a.m.  Leukocytosis - Follow CBCs.  History of colon cancer and lung cancer status post surgery - Said to be in remission.   Code Status: Full Family Communication: None at bedside Disposition Plan: Home when medically stable   Consultants:  None  Procedures:  None  Antibiotics:  IV Rocephin 3/26 >  IV azithromycin 3/26 >   Subjective: Patient states that she feels much better. Denies dyspnea or chest pain. Nursing reported single episode of cough with minimal blood in sputum. Denies leg pain. No further nausea, vomiting or diarrhea.  Objective: Filed Vitals:   05/09/14 0607 05/09/14 0806 05/09/14 0851 05/09/14 1454  BP: 98/62 105/51  112/54  Pulse: 92 91  93  Temp: 98.7 F (37.1 C) 98.4 F (36.9 C)  98.7 F (37.1 C)  TempSrc: Oral Oral  Oral  Resp: 20 18  16   Height:      Weight:      SpO2: 98% 93% 92% 98%    Intake/Output Summary (Last 24 hours) at 05/09/14 1540 Last data filed at 05/09/14 1436  Gross per 24 hour  Intake  61.13 ml  Output   1100 ml  Net -  1038.87 ml   Filed Weights   05/08/14 2303 05/09/14 0134  Weight: 111.131 kg (245 lb) 114.306 kg (252 lb)     Exam:  General exam: Pleasant elderly female lying comfortably propped up in bed. Seen eating breakfast this morning. Respiratory system: Diminished breath sounds in the bases but otherwise clear to auscultation. No increased work of  breathing. Cardiovascular system: S1 & S2 heard, RRR. No JVD, murmurs, gallops, clicks or pedal edema. Telemetry: Sinus rhythm. Gastrointestinal system: Abdomen is nondistended, soft and nontender. Normal bowel sounds heard. Central nervous system: Alert and oriented. No focal neurological deficits. Extremities: Symmetric 5 x 5 power.   Data Reviewed: Basic Metabolic Panel:  Recent Labs Lab 05/08/14 2033 05/09/14 0532  NA 139 140  K 4.4 4.3  CL 104 104  CO2 21 23  GLUCOSE 104* 93  BUN 30* 26*  CREATININE 1.57* 1.32*  CALCIUM 9.2 8.8   Liver Function Tests:  Recent Labs Lab 05/08/14 2033  AST 44*  ALT 31  ALKPHOS 81  BILITOT 2.5*  PROT 7.4  ALBUMIN 3.5   No results for input(s): LIPASE, AMYLASE in the last 168 hours. No results for input(s): AMMONIA in the last 168 hours. CBC:  Recent Labs Lab 05/08/14 2033 05/09/14 0532  WBC 18.0* 17.7*  NEUTROABS 15.9*  --   HGB 13.5 12.9  HCT 42.0 39.6  MCV 74.1* 73.7*  PLT 153 108*   Cardiac Enzymes: No results for input(s): CKTOTAL, CKMB, CKMBINDEX, TROPONINI in the last 168 hours. BNP (last 3 results) No results for input(s): PROBNP in the last 8760 hours. CBG: No results for input(s): GLUCAP in the last 168 hours.  Recent Results (from the past 240 hour(s))  Culture, sputum-assessment     Status: None   Collection Time: 05/09/14  1:58 AM  Result Value Ref Range Status   Specimen Description SPUTUM  Final   Special Requests NONE  Final   Sputum evaluation   Final    THIS SPECIMEN IS ACCEPTABLE. RESPIRATORY CULTURE REPORT TO FOLLOW.   Report Status 05/09/2014 FINAL  Final         Studies: Ct Angio Chest Pe W/cm &/or Wo Cm  05/08/2014   CLINICAL DATA:  Right side chest pain radiating to back. History of right lobectomy in 2009.  EXAM: CT ANGIOGRAPHY CHEST WITH CONTRAST  TECHNIQUE: Multidetector CT imaging of the chest was performed using the standard protocol during bolus administration of intravenous  contrast. Multiplanar CT image reconstructions and MIPs were obtained to evaluate the vascular anatomy.  CONTRAST:  14mL OMNIPAQUE IOHEXOL 350 MG/ML SOLN  COMPARISON:  None.  FINDINGS: Filling defects are noted within left upper lobe pulmonary arteries and right lower lung compatible with pulmonary emboli. Heart is normal size. Aorta is normal caliber. No mediastinal, hilar, or axillary adenopathy.  Bilateral airspace opacities are noted, most confluent in the bases, also noted in the right middle lobe. Prior postoperative changes from right lower lobectomy. No pleural effusions.  Chest wall soft tissues are unremarkable. Imaging into the upper abdomen shows no acute findings.  Review of the MIP images confirms the above findings.  IMPRESSION: Left upper lobe and right lower lung pulmonary emboli.  Bilateral airspace opacities, most confluent in the bases. Cannot exclude pneumonia.  Critical Value/emergent results were called by telephone at the time of interpretation on 05/08/2014 at 10:51 pm to Dr. Hyman Bible , who verbally acknowledged these results.   Electronically Signed   By: Rolm Baptise M.D.  On: 05/08/2014 22:53        Scheduled Meds: . azithromycin  500 mg Oral Q24H  . cefTRIAXone (ROCEPHIN)  IV  1 g Intravenous Q24H  . coumadin book   Does not apply Once  . cycloSPORINE  1 drop Both Eyes BID  . hydroxychloroquine  200 mg Oral Daily  . mometasone-formoterol  2 puff Inhalation BID  . multivitamin with minerals  1 tablet Oral Daily  . pantoprazole  40 mg Oral Daily  . pregabalin  300 mg Oral QHS  . tiotropium  18 mcg Inhalation Daily  . warfarin  7.5 mg Oral ONCE-1800  . Warfarin - Pharmacist Dosing Inpatient   Does not apply q1800   Continuous Infusions: . sodium chloride 75 mL/hr at 05/09/14 1158  . heparin 1,500 Units/hr (05/09/14 1418)    Active Problems:   CAP (community acquired pneumonia)   Pulmonary embolism   Acute on chronic respiratory failure with hypoxia    Left leg DVT   Leukocytosis    Time spent: 45 minutes.    Vernell Leep, MD, FACP, FHM. [facility] Pager [phone]  If 7PM-7AM, please contact night-coverage [email or web address] Password [email or web address] 05/09/2014, 3:40 PM    LOS: 1 day

## 2014-05-09 NOTE — Progress Notes (Signed)
Pt spit up some blood, MD and pharmacy made aware.  Will continue to monitor closely.

## 2014-05-09 NOTE — Progress Notes (Signed)
Utilization review completed.  

## 2014-05-10 ENCOUNTER — Inpatient Hospital Stay (HOSPITAL_COMMUNITY): Payer: Medicare Other

## 2014-05-10 LAB — HEPATIC FUNCTION PANEL
ALT: 24 U/L (ref 0–35)
AST: 34 U/L (ref 0–37)
Albumin: 3.4 g/dL — ABNORMAL LOW (ref 3.5–5.2)
Alkaline Phosphatase: 120 U/L — ABNORMAL HIGH (ref 39–117)
BILIRUBIN TOTAL: 0.9 mg/dL (ref 0.3–1.2)
Bilirubin, Direct: 0.3 mg/dL (ref 0.0–0.5)
Indirect Bilirubin: 0.6 mg/dL (ref 0.3–0.9)
TOTAL PROTEIN: 7.5 g/dL (ref 6.0–8.3)

## 2014-05-10 LAB — CBC
HCT: 34.3 % — ABNORMAL LOW (ref 36.0–46.0)
HEMOGLOBIN: 10.8 g/dL — AB (ref 12.0–15.0)
MCH: 23.5 pg — AB (ref 26.0–34.0)
MCHC: 31.5 g/dL (ref 30.0–36.0)
MCV: 74.7 fL — ABNORMAL LOW (ref 78.0–100.0)
PLATELETS: 120 10*3/uL — AB (ref 150–400)
RBC: 4.59 MIL/uL (ref 3.87–5.11)
RDW: 16.2 % — ABNORMAL HIGH (ref 11.5–15.5)
WBC: 15.8 10*3/uL — ABNORMAL HIGH (ref 4.0–10.5)

## 2014-05-10 LAB — LEGIONELLA ANTIGEN, URINE

## 2014-05-10 LAB — PROTIME-INR
INR: 1.2 (ref 0.00–1.49)
Prothrombin Time: 15.3 seconds — ABNORMAL HIGH (ref 11.6–15.2)

## 2014-05-10 LAB — BASIC METABOLIC PANEL
Anion gap: 9 (ref 5–15)
BUN: 15 mg/dL (ref 6–23)
CALCIUM: 8.3 mg/dL — AB (ref 8.4–10.5)
CO2: 24 mmol/L (ref 19–32)
Chloride: 107 mmol/L (ref 96–112)
Creatinine, Ser: 1 mg/dL (ref 0.50–1.10)
GFR calc Af Amer: 64 mL/min — ABNORMAL LOW (ref >90)
GFR, EST NON AFRICAN AMERICAN: 55 mL/min — AB (ref >90)
GLUCOSE: 117 mg/dL — AB (ref 70–99)
Potassium: 3.8 mmol/L (ref 3.5–5.1)
Sodium: 140 mmol/L (ref 135–145)

## 2014-05-10 LAB — HEPARIN LEVEL (UNFRACTIONATED): HEPARIN UNFRACTIONATED: 0.51 [IU]/mL (ref 0.30–0.70)

## 2014-05-10 MED ORDER — ENOXAPARIN SODIUM 120 MG/0.8ML ~~LOC~~ SOLN
110.0000 mg | Freq: Two times a day (BID) | SUBCUTANEOUS | Status: DC
  Administered 2014-05-10 – 2014-05-11 (×3): 110 mg via SUBCUTANEOUS
  Filled 2014-05-10 (×4): qty 0.8

## 2014-05-10 MED ORDER — ENOXAPARIN (LOVENOX) PATIENT EDUCATION KIT
PACK | Freq: Once | Status: AC
  Administered 2014-05-10: 19:00:00
  Filled 2014-05-10: qty 1

## 2014-05-10 NOTE — Progress Notes (Signed)
ANTICOAGULATION CONSULT NOTE -   Pharmacy Consult for Heparin/warfarin Indication: pulmonary embolus  No Known Allergies  Patient Measurements: Height: 5\' 2"  (157.5 cm) Weight: 252 lb (114.306 kg) IBW/kg (Calculated) : 50.1 Heparin Dosing Weight: 76.7 kg   Vital Signs: Temp: 100.4 F (38 C) (03/28 0425) Temp Source: Oral (03/28 0425) BP: 101/54 mmHg (03/28 0425) Pulse Rate: 91 (03/28 0425)  Labs:  Recent Labs  05/08/14 2033  05/08/14 2318 05/09/14 0532 05/09/14 0947 05/09/14 1900 05/10/14 0510  HGB 13.5  --   --  12.9  --   --  10.8*  HCT 42.0  --   --  39.6  --   --  34.3*  PLT 153  --   --  108*  --   --  120*  APTT  --   --  25  --   --   --   --   LABPROT  --   --  16.9*  --   --   --  15.3*  INR  --   --  1.36  --   --   --  1.20  HEPARINUNFRC  --   < > <0.10*  --  0.26* 0.20* 0.51  CREATININE 1.57*  --   --  1.32*  --   --  1.00  < > = values in this interval not displayed.  Estimated Creatinine Clearance: 61.7 mL/min (by C-G formula based on Cr of 1).  Medical History: Past Medical History  Diagnosis Date  . Hypertension   . DVT (deep venous thrombosis)   . Cancer     Lung CA s/p right lowerlobectomy  . Colon cancer    Medications:  Scheduled:  . azithromycin  500 mg Oral Q24H  . cefTRIAXone (ROCEPHIN)  IV  1 g Intravenous Q24H  . cycloSPORINE  1 drop Both Eyes BID  . hydroxychloroquine  200 mg Oral Daily  . mometasone-formoterol  2 puff Inhalation BID  . multivitamin with minerals  1 tablet Oral Daily  . pantoprazole  40 mg Oral Daily  . pregabalin  300 mg Oral QHS  . tiotropium  18 mcg Inhalation Daily  . Warfarin - Pharmacist Dosing Inpatient   Does not apply q1800   Infusions:  . sodium chloride 75 mL/hr at 05/09/14 1158  . heparin 1,800 Units/hr (05/10/14 0205)   Assessment: 69 yr female travelling via train and developed chest pain, lethargy.  H/O RLL lobectomy. Previous hx of DVT on chronic anti-coagulation stopped several months ago  for vaginal bleeding s/p D&C.  CTAngio shows + left upper lobe and right lower lung PE  Pharmacy consulted to dose IV Heparin/warfarin for +PE-Heparin begun 3/26, Warfarin 3/27  Today 3/27  Heparin level therapeutic (0.51 units/ml) @ 1800 Units/hr  No bleeding per RN  INR 1.20, Hgb decreased 10.8, plts up to 120K  Goal of Therapy:  Heparin level 0.3-0.7 units/ml Monitor platelets by anticoagulation protocol: Yes   Plan:   Continue Heparin infusion at 1800 units/hr  Check heparin level in 8 hr  Follow heparin level & CBC daily  Warfarin 7.5mg  x1 @ 1000  Daily INR  Pharmacy to provide warfarin education  Minda Ditto PharmD Pager [phone] 05/10/2014, 8:26 AM

## 2014-05-10 NOTE — Progress Notes (Signed)
Lovenox education kit given to pt.

## 2014-05-10 NOTE — Progress Notes (Signed)
Attempted to wean O2 while patient was sleeping. Placed patient on 4 L [place], but saturations were only in low 90s or upper 80s with no movement. Pt's saturations immediatly dropped to the 70s with exertion while on 4 L. Will continue to monitor.

## 2014-05-10 NOTE — Progress Notes (Signed)
ANTICOAGULATION CONSULT NOTE -   Pharmacy Consult for Heparin transition to Lovenox, d/c Warfarin Indication: pulmonary embolus  No Known Allergies  Patient Measurements: Height: 5\' 2"  (157.5 cm) Weight: 252 lb (114.306 kg) IBW/kg (Calculated) : 50.1 Heparin Dosing Weight: 76.7 kg   Vital Signs: Temp: 100.4 F (38 C) (03/28 0425) Temp Source: Oral (03/28 0425) BP: 101/54 mmHg (03/28 0425) Pulse Rate: 86 (03/28 0850)  Labs:  Recent Labs  05/08/14 2033  05/08/14 2318 05/09/14 0532 05/09/14 0947 05/09/14 1900 05/10/14 0510  HGB 13.5  --   --  12.9  --   --  10.8*  HCT 42.0  --   --  39.6  --   --  34.3*  PLT 153  --   --  108*  --   --  120*  APTT  --   --  25  --   --   --   --   LABPROT  --   --  16.9*  --   --   --  15.3*  INR  --   --  1.36  --   --   --  1.20  HEPARINUNFRC  --   < > <0.10*  --  0.26* 0.20* 0.51  CREATININE 1.57*  --   --  1.32*  --   --  1.00  < > = values in this interval not displayed.  Estimated Creatinine Clearance: 61.7 mL/min (by C-G formula based on Cr of 1).  Medical History: Past Medical History  Diagnosis Date  . Hypertension   . DVT (deep venous thrombosis)   . Cancer     Lung CA s/p right lowerlobectomy  . Colon cancer    Medications:  Scheduled:  . azithromycin  500 mg Oral Q24H  . cefTRIAXone (ROCEPHIN)  IV  1 g Intravenous Q24H  . cycloSPORINE  1 drop Both Eyes BID  . enoxaparin (LOVENOX) injection  110 mg Subcutaneous BID  . hydroxychloroquine  200 mg Oral Daily  . mometasone-formoterol  2 puff Inhalation BID  . multivitamin with minerals  1 tablet Oral Daily  . pantoprazole  40 mg Oral Daily  . pregabalin  300 mg Oral QHS  . tiotropium  18 mcg Inhalation Daily  . Warfarin - Pharmacist Dosing Inpatient   Does not apply q1800    Assessment: 69 yr female travelling via train and developed chest pain, lethargy.  H/O RLL lobectomy and previous hx of DVT on chronic anti-coagulation (Xarelto) stopped several months ago  for vaginal bleeding s/p D&C.  CTAngio shows + left upper lobe and right lower lung PE  Pharmacy consulted to dose IV Heparin/warfarin for +PE-Heparin begun 3/26, Warfarin 3/27  Change anti-coagulation to Lovenox, will begin with 1mg /kg q12, plan change to 1.5mg /kd q24  Today 3/28  Heparin level therapeutic was (0.51 units/ml) @ 1800 Units/hr  No bleeding per RN  INR 1.20, Hgb decreased 10.8, plts up to 120K  Begin Lovenox at 12N, discontinue Heparin at 1100  Goal of Therapy:  Anti-Xa level: 0.6-1  Monitor platelets by anticoagulation protocol: Yes   Plan:    discontinued Heparin infusion   Lovenox 110mg  SQ q12  Strongly considering AntiXa level tomorrow after 3rd dose, pt obese  Will change Lovenox dose to 1.5mg /kg when tolerating Lovenox injections, level in range  Pharmacy to provide Lovenox education  Minda Ditto PharmD Pager [phone] 05/10/2014, 12:23 PM

## 2014-05-10 NOTE — Progress Notes (Addendum)
PROGRESS NOTE    Erin Park NTI:[id number] DOB: 1946-01-20 DOA: 05/08/2014 PCP: No primary care provider on file.  HPI/Brief narrative 69 year old female patient with history of Colon Ca  s/p surgery 2004 and lung cancer s/p RLL lobectomy 2009, never received chemotherapy or radiation, prior history of DVT and was on Coumadin from 2009 until 2015 when she was switched to Xarelto but started having vaginal bleeding issues at which time all anticoagulation was discontinued, chronic respiratory failure on 2 L/m home oxygen with activity or PRN & HTN. Workup by OB/GYN apparently did not reveal a cause for vaginal bleeding. Patient lives in [place] and is visiting family in the [place] area. Prior to boarding her train at about 6:30 AM on 05/08/14, she was feeling unwell with nausea, vomiting and diarrhea. She progressively got worse on the train with complaints of right-sided chest pain, dyspnea, left lower extremity leg and calf pain. She felt feverish but did not check her temperatures. She is diagnosed with bilateral acute PE, possible community-acquired pneumonia and acute on chronic respiratory failure with hypoxia.   Assessment/Plan:  Bilateral acute pulmonary embolism LUL and RLL - Patient admitted to telemetry: Sinus rhythm - Started on IV heparin drip initially and switched to Lovenox bridging on 3/28. - Bilateral lower extremity venous Dopplers are negative for DVT. - Patient had one episode of trace blood in sputum but no other overt or significant bleeding. No further hemoptysis reported. - Discussed oral anticoagulants 3/27 including Coumadin and NOAC's including risks, benefits and side effects. Patient opted to proceed with Coumadin. - Platelet count had dropped from 153 > 108. But has improved to 120. States that she may have been told to have low platelets in the past. - Still hypoxic.  Possible community-acquired pneumonia - Continue IV Rocephin and azithromycin - repeat  CXR 3/28 shows bibasal opacities like prior CT. - Sputum culture: Pending. - Urine Legionella antigen: Pending - Urine pneumococcal antigen: Negative - Blood cultures 2: Negative to date.  Acute on chronic hypoxic respiratory failure - Precipitated by bilateral acute PE and possible CAP. - Continue oxygen supplementation - On 2 L home O2 with activity and as needed. Currently on 6 L/m. Titrate down as tolerated. - Aggressive incentive spirometry.  Acute on chronic kidney disease - Likely secondary to GI losses, poor oral intake and dehydration - Acute renal failure resolved after gentle IV fluid hydration. - Requesting office records from PCP.  Thrombocytopenia - Unsure etiology. - Improved.? Chronic.  Nausea, vomiting and diarrhea - Self-limited. Resolved - ? Secondary to acute viral illness - Total bilirubin is elevated but other LFTs are unremarkable. Follow LFTs.  Leukocytosis - Improving  History of colon cancer and lung cancer status post surgery - Said to be in remission.  Anemia - ? Dilutional. No reported bleeding. - Follow CBCs.   Code Status: Full Family Communication: None at bedside. Discussed with patient's son Mr. Milia Warth ([phone]). Disposition Plan: Home possibly in 48 hours.   Consultants:  None  Procedures:  None  Antibiotics:  IV Rocephin 3/26 >  IV azithromycin 3/26 >   Subjective: States that she continues to feel better. Nonproductive cough. Right lower anterior chest pain only on coughing. No dyspnea. No nausea, vomiting or diarrhea.  Objective: Filed Vitals:   05/09/14 1927 05/09/14 2117 05/10/14 0425 05/10/14 0850  BP:  122/84 101/54   Pulse:  94 91 86  Temp:  98.7 F (37.1 C) 100.4 F (38 C)   TempSrc:  Oral Oral   Resp:  18 18 18   Height:      Weight:      SpO2: 94% 96% 96% 94%    Intake/Output Summary (Last 24 hours) at 05/10/14 1138 Last data filed at 05/10/14 0600  Gross per 24 hour  Intake    530 ml    Output   1500 ml  Net   -970 ml   Filed Weights   05/08/14 2303 05/09/14 0134  Weight: 111.131 kg (245 lb) 114.306 kg (252 lb)     Exam:  General exam: Pleasant elderly female sitting up comfortably in chair. Respiratory system: Diminished breath sounds in the bases with few basal crackles but otherwise clear to auscultation. No increased work of breathing. Cardiovascular system: S1 & S2 heard, RRR. No JVD, murmurs, gallops, clicks or pedal edema. Telemetry: Sinus rhythm. Gastrointestinal system: Abdomen is nondistended, soft and nontender. Normal bowel sounds heard. Central nervous system: Alert and oriented. No focal neurological deficits. Extremities: Symmetric 5 x 5 power.   Data Reviewed: Basic Metabolic Panel:  Recent Labs Lab 05/08/14 2033 05/09/14 0532 05/10/14 0510  NA 139 140 140  K 4.4 4.3 3.8  CL 104 104 107  CO2 21 23 24   GLUCOSE 104* 93 117*  BUN 30* 26* 15  CREATININE 1.57* 1.32* 1.00  CALCIUM 9.2 8.8 8.3*   Liver Function Tests:  Recent Labs Lab 05/08/14 2033  AST 44*  ALT 31  ALKPHOS 81  BILITOT 2.5*  PROT 7.4  ALBUMIN 3.5   No results for input(s): LIPASE, AMYLASE in the last 168 hours. No results for input(s): AMMONIA in the last 168 hours. CBC:  Recent Labs Lab 05/08/14 2033 05/09/14 0532 05/10/14 0510  WBC 18.0* 17.7* 15.8*  NEUTROABS 15.9*  --   --   HGB 13.5 12.9 10.8*  HCT 42.0 39.6 34.3*  MCV 74.1* 73.7* 74.7*  PLT 153 108* 120*   Cardiac Enzymes: No results for input(s): CKTOTAL, CKMB, CKMBINDEX, TROPONINI in the last 168 hours. BNP (last 3 results) No results for input(s): PROBNP in the last 8760 hours. CBG: No results for input(s): GLUCAP in the last 168 hours.  Recent Results (from the past 240 hour(s))  Blood culture (routine x 2)     Status: None (Preliminary result)   Collection Time: 05/08/14 11:35 PM  Result Value Ref Range Status   Specimen Description BLOOD R UPPER ARM  Final   Special Requests BOTTLES  DRAWN AEROBIC AND ANAEROBIC 5ML  Final   Culture   Final           BLOOD CULTURE RECEIVED NO GROWTH TO DATE CULTURE WILL BE HELD FOR 5 DAYS BEFORE ISSUING A FINAL NEGATIVE REPORT Performed at [facility]    Report Status PENDING  Incomplete  Blood culture (routine x 2)     Status: None (Preliminary result)   Collection Time: 05/08/14 11:40 PM  Result Value Ref Range Status   Specimen Description BLOOD L ARM  Final   Special Requests BOTTLES DRAWN AEROBIC AND ANAEROBIC 3ML  Final   Culture   Final           BLOOD CULTURE RECEIVED NO GROWTH TO DATE CULTURE WILL BE HELD FOR 5 DAYS BEFORE ISSUING A FINAL NEGATIVE REPORT Performed at [facility]    Report Status PENDING  Incomplete  Culture, sputum-assessment     Status: None   Collection Time: 05/09/14  1:58 AM  Result Value Ref Range Status   Specimen  Description SPUTUM  Final   Special Requests NONE  Final   Sputum evaluation   Final    THIS SPECIMEN IS ACCEPTABLE. RESPIRATORY CULTURE REPORT TO FOLLOW.   Report Status 05/09/2014 FINAL  Final  Culture, respiratory (NON-Expectorated)     Status: None (Preliminary result)   Collection Time: 05/09/14  1:58 AM  Result Value Ref Range Status   Specimen Description SPU  Final   Special Requests NONE  Final   Gram Stain   Final    ABUNDANT WBC PRESENT, PREDOMINANTLY PMN RARE SQUAMOUS EPITHELIAL CELLS PRESENT ABUNDANT GRAM NEGATIVE COCCI ABUNDANT GRAM POSITIVE COCCI IN PAIRS IN CLUSTERS    Culture PENDING  Incomplete   Report Status PENDING  Incomplete         Studies: Ct Angio Chest Pe W/cm &/or Wo Cm  05/08/2014   CLINICAL DATA:  Right side chest pain radiating to back. History of right lobectomy in 2009.  EXAM: CT ANGIOGRAPHY CHEST WITH CONTRAST  TECHNIQUE: Multidetector CT imaging of the chest was performed using the standard protocol during bolus administration of intravenous contrast. Multiplanar CT image reconstructions and MIPs were obtained to evaluate  the vascular anatomy.  CONTRAST:  18mL OMNIPAQUE IOHEXOL 350 MG/ML SOLN  COMPARISON:  None.  FINDINGS: Filling defects are noted within left upper lobe pulmonary arteries and right lower lung compatible with pulmonary emboli. Heart is normal size. Aorta is normal caliber. No mediastinal, hilar, or axillary adenopathy.  Bilateral airspace opacities are noted, most confluent in the bases, also noted in the right middle lobe. Prior postoperative changes from right lower lobectomy. No pleural effusions.  Chest wall soft tissues are unremarkable. Imaging into the upper abdomen shows no acute findings.  Review of the MIP images confirms the above findings.  IMPRESSION: Left upper lobe and right lower lung pulmonary emboli.  Bilateral airspace opacities, most confluent in the bases. Cannot exclude pneumonia.  Critical Value/emergent results were called by telephone at the time of interpretation on 05/08/2014 at 10:51 pm to Dr. Hyman Bible , who verbally acknowledged these results.   Electronically Signed   By: Rolm Baptise M.D.   On: 05/08/2014 22:53   Dg Chest Port 1 View  05/10/2014   CLINICAL DATA:  Hypoxia. History of right lower lobectomy for lung cancer.  EXAM: PORTABLE CHEST - 1 VIEW  COMPARISON:  CTA chest 05/08/2014  FINDINGS: The patient is rotated to the right, limiting evaluation of the mediastinum. Surgical clips and staple lines are noted in the right lung from prior lobectomy. Bibasilar lung opacities are similar in extends to the recent CT. No definite pleural effusion or pneumothorax is identified.  IMPRESSION: Bibasilar lung opacities, similar to the recent CT and concerning for pneumonia.   Electronically Signed   By: Logan Bores   On: 05/10/2014 08:02        Scheduled Meds: . azithromycin  500 mg Oral Q24H  . cefTRIAXone (ROCEPHIN)  IV  1 g Intravenous Q24H  . cycloSPORINE  1 drop Both Eyes BID  . enoxaparin (LOVENOX) injection  110 mg Subcutaneous BID  . hydroxychloroquine  200 mg  Oral Daily  . mometasone-formoterol  2 puff Inhalation BID  . multivitamin with minerals  1 tablet Oral Daily  . pantoprazole  40 mg Oral Daily  . pregabalin  300 mg Oral QHS  . tiotropium  18 mcg Inhalation Daily  . Warfarin - Pharmacist Dosing Inpatient   Does not apply q1800   Continuous Infusions:    Active  Problems:   CAP (community acquired pneumonia)   Pulmonary embolism   Acute on chronic respiratory failure with hypoxia   Left leg DVT   Leukocytosis    Time spent: 25 minutes.    Vernell Leep, MD, FACP, FHM. [facility] Pager [phone]  If 7PM-7AM, please contact night-coverage [email or web address] Password [facility] 05/10/2014, 11:38 AM    LOS: 2 days

## 2014-05-11 LAB — CULTURE, RESPIRATORY

## 2014-05-11 LAB — CBC
HEMATOCRIT: 35.7 % — AB (ref 36.0–46.0)
HEMOGLOBIN: 11.3 g/dL — AB (ref 12.0–15.0)
MCH: 23.4 pg — ABNORMAL LOW (ref 26.0–34.0)
MCHC: 31.7 g/dL (ref 30.0–36.0)
MCV: 73.9 fL — ABNORMAL LOW (ref 78.0–100.0)
Platelets: 114 10*3/uL — ABNORMAL LOW (ref 150–400)
RBC: 4.83 MIL/uL (ref 3.87–5.11)
RDW: 16.2 % — ABNORMAL HIGH (ref 11.5–15.5)
WBC: 14.7 10*3/uL — ABNORMAL HIGH (ref 4.0–10.5)

## 2014-05-11 LAB — CULTURE, RESPIRATORY W GRAM STAIN

## 2014-05-11 LAB — HEPARIN ANTI-XA: HEPARIN LMW: 1.17 [IU]/mL

## 2014-05-11 LAB — HIV ANTIBODY (ROUTINE TESTING W REFLEX): HIV Screen 4th Generation wRfx: NONREACTIVE

## 2014-05-11 MED ORDER — WARFARIN SODIUM 5 MG PO TABS
7.5000 mg | ORAL_TABLET | Freq: Once | ORAL | Status: AC
  Administered 2014-05-11: 7.5 mg via ORAL
  Filled 2014-05-11 (×2): qty 1

## 2014-05-11 MED ORDER — ENOXAPARIN SODIUM 100 MG/ML ~~LOC~~ SOLN
100.0000 mg | Freq: Two times a day (BID) | SUBCUTANEOUS | Status: DC
  Administered 2014-05-11 – 2014-05-14 (×6): 100 mg via SUBCUTANEOUS
  Filled 2014-05-11 (×6): qty 1

## 2014-05-11 MED ORDER — WARFARIN VIDEO
Freq: Once | Status: AC
  Administered 2014-05-11: 11:00:00

## 2014-05-11 MED ORDER — WARFARIN - PHARMACIST DOSING INPATIENT: Freq: Every day | Status: DC

## 2014-05-11 NOTE — Progress Notes (Signed)
Appointment at [facility] and [facility] on [place], 05/13/2014 at 10 AM and labs at 1030 AM.

## 2014-05-11 NOTE — Progress Notes (Addendum)
ANTICOAGULATION CONSULT NOTE -   Pharmacy Consult for Lovenox, Warfarin Indication: pulmonary embolus  No Known Allergies  Patient Measurements: Height: _0  (157.5 cm) Weight: 252 lb (114.306 kg) IBW/kg (Calculated) : 50.1  Vital Signs: Temp: 98.8 F (37.1 C) (03/29 0518) Temp Source: Oral (03/29 0518) BP: 100/46 mmHg (03/29 0535) Pulse Rate: 86 (03/29 0535)  Labs:  Recent Labs  05/08/14 2033  05/08/14 2318 05/09/14 0532 05/09/14 0947 05/09/14 1900 05/10/14 0510 05/11/14 0436  HGB 13.5  --   --  12.9  --   --  10.8* 11.3*  HCT 42.0  --   --  39.6  --   --  34.3* 35.7*  PLT 153  --   --  108*  --   --  120* 114*  APTT  --   --  25  --   --   --   --   --   LABPROT  --   --  16.9*  --   --   --  15.3*  --   INR  --   --  1.36  --   --   --  1.20  --   HEPARINUNFRC  --   < > <0.10*  --  0.26* 0.20* 0.51  --   CREATININE 1.57*  --   --  1.32*  --   --  1.00  --   < > = values in this interval not displayed.  Estimated Creatinine Clearance: 61.7 mL/min (by C-G formula based on Cr of 1).  Medical History: Past Medical History  Diagnosis Date  . Hypertension   . DVT (deep venous thrombosis)   . Cancer     Lung CA s/p right lowerlobectomy  . Colon cancer    Medications:  Scheduled:  . azithromycin  500 mg Oral Q24H  . cefTRIAXone (ROCEPHIN)  IV  1 g Intravenous Q24H  . cycloSPORINE  1 drop Both Eyes BID  . enoxaparin (LOVENOX) injection  110 mg Subcutaneous BID  . hydroxychloroquine  200 mg Oral Daily  . mometasone-formoterol  2 puff Inhalation BID  . multivitamin with minerals  1 tablet Oral Daily  . pantoprazole  40 mg Oral Daily  . pregabalin  300 mg Oral QHS  . tiotropium  18 mcg Inhalation Daily  . warfarin  7.5 mg Oral Once  . Warfarin - Pharmacist Dosing Inpatient   Does not apply q1800    Assessment: 69 yr female travelling via train and developed chest pain, lethargy.  H/O RLL lobectomy and previous hx of DVT on chronic anti-coagulation (Xarelto)  stopped several months ago for vaginal bleeding s/p D&C.  CTAngio shows + left upper lobe and right lower lung PE  Pharmacy consulted to dose IV Heparin/warfarin for +PE-Heparin begun 3/26, received warfarin 3/27  Heparin transitioned to Lovenox 3/28  Warfarin resumed today  Today, 05/11/2014:  SCr improved to 1 (3/28), CrCl ~ 62 mL/min  Hgb improved to 11.3, Pltc trended down to 114  No bleeding issues noted per nursing  AST/ALT WNL, Alk phos slightly elevated  INR 1.2 on 3/28   Diet: heart healthy, eating 50-100% of meals  Drug interactions with warfarin: broad spectrum antibiotcs may reduce warfarin needs  Goal of Therapy:  Anti-Xa level: 0.6-1 units/mL 4 hours after Lovenox dose given Monitor platelets by anticoagulation protocol: Yes   Plan:   Continue Lovenox 194m SQ q12h.  Check anti-Xa level today at 1530 (~ 4 hours after lovenox dose given this AM)  as patient is obese and at risk for accumulation.   Warfarin 7.5 mg PO x 1 today.  Daily PT/INR  Patient will need 5 days of overlap with Lovenox/Warfarin and INR > 2 for at least 24 hours prior to discontinuation of Lovenox.  Monitor for signs/symptoms of bleeding.  Lovenox education provided to patient 3/28. Warfarin book and video also provided. Will provide verbal education to patient as well.   Lindell Spar, PharmD, BCPS Pager: [phone] 05/11/2014 2:15 PM

## 2014-05-11 NOTE — Progress Notes (Signed)
PROGRESS NOTE    Erin Park TDD:[id number] DOB: March 08, 1945 DOA: 05/08/2014 PCP: No primary care provider on file.  HPI/Brief narrative 69 year old female patient with history of Colon Ca  s/p surgery 2004 and lung cancer s/p RLL lobectomy 2009, never received chemotherapy or radiation, prior history of DVT and was on Coumadin from 2009 until 2015 when she was switched to Xarelto but started having vaginal bleeding issues at which time all anticoagulation was discontinued, chronic respiratory failure on 2 L/m home oxygen with activity or PRN & HTN. Workup by OB/GYN apparently did not reveal a cause for vaginal bleeding. Patient lives in [place] and is visiting family in the [place] area. Prior to boarding her train at about 6:30 AM on 05/08/14, she was feeling unwell with nausea, vomiting and diarrhea. She progressively got worse on the train with complaints of right-sided chest pain, dyspnea, left lower extremity leg and calf pain. She felt feverish but did not check her temperatures. She is diagnosed with bilateral acute PE, possible community-acquired pneumonia and acute on chronic respiratory failure with hypoxia.   Assessment/Plan:  Bilateral acute pulmonary embolism LUL and RLL - Patient admitted to telemetry: Sinus rhythm - Started on IV heparin drip initially and switched to Lovenox bridging on 3/28. - Bilateral lower extremity venous Dopplers are negative for DVT. - Patient had one episode of trace blood in sputum but no other overt or significant bleeding. No further hemoptysis reported. - Discussed oral anticoagulants 3/27 including Coumadin and NOAC's including risks, benefits and side effects. Patient opted to proceed with Coumadin. - Platelet count had dropped from 153 > 108. But has improved to 120. States that she may have been told to have low platelets in the past. - Still hypoxic but O2 requirement has decreased from 6>4L/min - Missed Coumadin on 3/28 and hence day 1/5  bridging today. - Will need close OP follow up re Coumadin dose Mx and DC Lovenox when appropriate. - Has FU at [facility] on 3/31 and further checks probably in [place]. Son planning to arrive 3/30 and take patient back to [place] on Mon.  Possible community-acquired pneumonia - Continue IV Rocephin and azithromycin - repeat CXR 3/28 shows bibasal opacities like prior CT. - Sputum culture: Beta Lactamase + Moraxella. - Urine Legionella antigen: Pending - Urine pneumococcal antigen: Neg - Blood cultures 2: Negative to date.  Acute on chronic hypoxic respiratory failure - Precipitated by bilateral acute PE and possible CAP. - Continue oxygen supplementation - On 2 L home O2 with activity and as needed. Currently on 4 L/m. Titrate down as tolerated. - Aggressive incentive spirometry.  Acute on chronic kidney disease - Likely secondary to GI losses, poor oral intake and dehydration - Acute renal failure resolved after gentle IV fluid hydration. - Requesting office records from PCP.  Thrombocytopenia - Unsure etiology. - Improved/stable.? Chronic.  Nausea, vomiting and diarrhea - Self-limited. Resolved - ? Secondary to acute viral illness - Total bilirubin is elevated but other LFTs are unremarkable. Follow LFTs - improved.  Leukocytosis - Improving  History of colon cancer and lung cancer status post surgery - Said to be in remission.  Anemia - ? Dilutional. No reported bleeding. - Follow CBCs- stable.   Code Status: Full Family Communication: None at bedside. Discussed with patient's son Mr. Ardyn Forge ([phone]) on 3/28. Disposition Plan: Home possibly 3/30.   Consultants:  None  Procedures:  None  Antibiotics:  IV Rocephin 3/26 >  IV azithromycin 3/26 >  Subjective: Continues to feel better. Cough with intermittent brown sputum- no blood. No SOB. Pleuritic R lower CP better.  Objective: Filed Vitals:   05/11/14 0518 05/11/14 0535 05/11/14 1214 05/11/14  1411  BP: 79/63 100/46  99/60  Pulse: 87 86  85  Temp: 98.8 F (37.1 C)   98.1 F (36.7 C)  TempSrc: Oral   Oral  Resp: 20   20  Height:      Weight:      SpO2: 98%  98% 93%    Intake/Output Summary (Last 24 hours) at 05/11/14 1616 Last data filed at 05/11/14 1511  Gross per 24 hour  Intake    650 ml  Output   1050 ml  Net   -400 ml   Filed Weights   05/08/14 2303 05/09/14 0134  Weight: 111.131 kg (245 lb) 114.306 kg (252 lb)     Exam:  General exam: Pleasant elderly female sitting up comfortably in chair. Respiratory system: Diminished breath sounds in the bases but otherwise clear to auscultation. No increased work of breathing. Cardiovascular system: S1 & S2 heard, RRR. No JVD, murmurs, gallops, clicks or pedal edema. Telemetry: Sinus rhythm. Gastrointestinal system: Abdomen is nondistended, soft and nontender. Normal bowel sounds heard. Central nervous system: Alert and oriented. No focal neurological deficits. Extremities: Symmetric 5 x 5 power.   Data Reviewed: Basic Metabolic Panel:  Recent Labs Lab 05/08/14 2033 05/09/14 0532 05/10/14 0510  NA 139 140 140  K 4.4 4.3 3.8  CL 104 104 107  CO2 21 23 24   GLUCOSE 104* 93 117*  BUN 30* 26* 15  CREATININE 1.57* 1.32* 1.00  CALCIUM 9.2 8.8 8.3*   Liver Function Tests:  Recent Labs Lab 05/08/14 2033 05/10/14 1150  AST 44* 34  ALT 31 24  ALKPHOS 81 120*  BILITOT 2.5* 0.9  PROT 7.4 7.5  ALBUMIN 3.5 3.4*   No results for input(s): LIPASE, AMYLASE in the last 168 hours. No results for input(s): AMMONIA in the last 168 hours. CBC:  Recent Labs Lab 05/08/14 2033 05/09/14 0532 05/10/14 0510 05/11/14 0436  WBC 18.0* 17.7* 15.8* 14.7*  NEUTROABS 15.9*  --   --   --   HGB 13.5 12.9 10.8* 11.3*  HCT 42.0 39.6 34.3* 35.7*  MCV 74.1* 73.7* 74.7* 73.9*  PLT 153 108* 120* 114*   Cardiac Enzymes: No results for input(s): CKTOTAL, CKMB, CKMBINDEX, TROPONINI in the last 168 hours. BNP (last 3  results) No results for input(s): PROBNP in the last 8760 hours. CBG: No results for input(s): GLUCAP in the last 168 hours.  Recent Results (from the past 240 hour(s))  Blood culture (routine x 2)     Status: None (Preliminary result)   Collection Time: 05/08/14 11:35 PM  Result Value Ref Range Status   Specimen Description BLOOD R UPPER ARM  Final   Special Requests BOTTLES DRAWN AEROBIC AND ANAEROBIC 5ML  Final   Culture   Final           BLOOD CULTURE RECEIVED NO GROWTH TO DATE CULTURE WILL BE HELD FOR 5 DAYS BEFORE ISSUING A FINAL NEGATIVE REPORT Performed at [facility]    Report Status PENDING  Incomplete  Blood culture (routine x 2)     Status: None (Preliminary result)   Collection Time: 05/08/14 11:40 PM  Result Value Ref Range Status   Specimen Description BLOOD L ARM  Final   Special Requests BOTTLES DRAWN AEROBIC AND ANAEROBIC 3ML  Final  Culture   Final           BLOOD CULTURE RECEIVED NO GROWTH TO DATE CULTURE WILL BE HELD FOR 5 DAYS BEFORE ISSUING A FINAL NEGATIVE REPORT Performed at [facility]    Report Status PENDING  Incomplete  Culture, sputum-assessment     Status: None   Collection Time: 05/09/14  1:58 AM  Result Value Ref Range Status   Specimen Description SPUTUM  Final   Special Requests NONE  Final   Sputum evaluation   Final    THIS SPECIMEN IS ACCEPTABLE. RESPIRATORY CULTURE REPORT TO FOLLOW.   Report Status 05/09/2014 FINAL  Final  Culture, respiratory (NON-Expectorated)     Status: None   Collection Time: 05/09/14  1:58 AM  Result Value Ref Range Status   Specimen Description SPU  Final   Special Requests NONE  Final   Gram Stain   Final    ABUNDANT WBC PRESENT, PREDOMINANTLY PMN RARE SQUAMOUS EPITHELIAL CELLS PRESENT ABUNDANT GRAM NEGATIVE COCCI ABUNDANT GRAM POSITIVE COCCI IN PAIRS IN CLUSTERS    Culture   Final    ABUNDANT MORAXELLA CATARRHALIS(BRANHAMELLA) Note: BETA LACTAMASE POSITIVE Performed at [facility]    Report Status 05/11/2014 FINAL  Final         Studies: Dg Chest Port 1 View  05/10/2014   CLINICAL DATA:  Hypoxia. History of right lower lobectomy for lung cancer.  EXAM: PORTABLE CHEST - 1 VIEW  COMPARISON:  CTA chest 05/08/2014  FINDINGS: The patient is rotated to the right, limiting evaluation of the mediastinum. Surgical clips and staple lines are noted in the right lung from prior lobectomy. Bibasilar lung opacities are similar in extends to the recent CT. No definite pleural effusion or pneumothorax is identified.  IMPRESSION: Bibasilar lung opacities, similar to the recent CT and concerning for pneumonia.   Electronically Signed   By: Logan Bores   On: 05/10/2014 08:02        Scheduled Meds: . azithromycin  500 mg Oral Q24H  . cefTRIAXone (ROCEPHIN)  IV  1 g Intravenous Q24H  . cycloSPORINE  1 drop Both Eyes BID  . enoxaparin (LOVENOX) injection  110 mg Subcutaneous BID  . hydroxychloroquine  200 mg Oral Daily  . mometasone-formoterol  2 puff Inhalation BID  . multivitamin with minerals  1 tablet Oral Daily  . pantoprazole  40 mg Oral Daily  . pregabalin  300 mg Oral QHS  . tiotropium  18 mcg Inhalation Daily  . Warfarin - Pharmacist Dosing Inpatient   Does not apply q1800   Continuous Infusions:    Active Problems:   CAP (community acquired pneumonia)   Pulmonary embolism   Acute on chronic respiratory failure with hypoxia   Left leg DVT   Leukocytosis    Time spent: 25 minutes.    Vernell Leep, MD, FACP, FHM. [facility] Pager [phone]  If 7PM-7AM, please contact night-coverage [email or web address] Password [email or web address] 05/11/2014, 4:16 PM    LOS: 3 days

## 2014-05-11 NOTE — Progress Notes (Signed)
ANTICOAGULATION CONSULT NOTE - FOLLOW UP  Pharmacy Consult for Lovenox, Warfarin Indication: pulmonary embolus  See previous note from Lindell Spar, PharmD for full details.  AntiXa level = 1.17 units/ml 4 hrs after 3rd dose of 110mg  q12h which is slightly above stated goal of 0.6-1 units/ml  No bleeding reported per notes.  Since further accumulation may occur with continued dosing, will decrease dose slightly.  Plan:  Decrease Lovenox to 100mg  sq q12h  Continue warfarin as previously ordered  Continue to monitor CBC, renal function, signs/symptoms of bleeding  Peggyann Juba, PharmD, BCPS Pager: [phone] 05/11/2014 4:50 PM

## 2014-05-12 LAB — CBC
HEMATOCRIT: 37.2 % (ref 36.0–46.0)
HEMOGLOBIN: 11.8 g/dL — AB (ref 12.0–15.0)
MCH: 23.5 pg — ABNORMAL LOW (ref 26.0–34.0)
MCHC: 31.7 g/dL (ref 30.0–36.0)
MCV: 74.1 fL — AB (ref 78.0–100.0)
PLATELETS: 122 10*3/uL — AB (ref 150–400)
RBC: 5.02 MIL/uL (ref 3.87–5.11)
RDW: 16.2 % — AB (ref 11.5–15.5)
WBC: 9.7 10*3/uL (ref 4.0–10.5)

## 2014-05-12 LAB — PROTIME-INR
INR: 1.2 (ref 0.00–1.49)
PROTHROMBIN TIME: 15.3 s — AB (ref 11.6–15.2)

## 2014-05-12 MED ORDER — AZITHROMYCIN 500 MG PO TABS: 500.0000 mg | ORAL_TABLET | ORAL | Status: AC

## 2014-05-12 MED ORDER — DILTIAZEM HCL ER 120 MG PO CP24: 120.0000 mg | ORAL_CAPSULE | Freq: Every day | ORAL | Status: AC

## 2014-05-12 MED ORDER — ENOXAPARIN SODIUM 100 MG/ML ~~LOC~~ SOLN: 100.0000 mg | Freq: Two times a day (BID) | SUBCUTANEOUS | Status: AC

## 2014-05-12 MED ORDER — SPIRONOLACTONE-HCTZ 25-25 MG PO TABS: 1.0000 | ORAL_TABLET | Freq: Every day | ORAL | Status: AC

## 2014-05-12 MED ORDER — WARFARIN SODIUM 5 MG PO TABS: 5.0000 mg | ORAL_TABLET | Freq: Every day | ORAL | Status: AC

## 2014-05-12 MED ORDER — WARFARIN SODIUM 5 MG PO TABS
7.5000 mg | ORAL_TABLET | Freq: Once | ORAL | Status: AC
  Administered 2014-05-12: 7.5 mg via ORAL
  Filled 2014-05-12 (×2): qty 1

## 2014-05-12 MED ORDER — WARFARIN SODIUM 5 MG PO TABS: 5.0000 mg | ORAL_TABLET | Freq: Every day | ORAL | Status: DC

## 2014-05-12 NOTE — Discharge Instructions (Signed)
Information on my medicine - Coumadin   (Warfarin)  This medication education was reviewed with me or my healthcare representative as part of my discharge preparation.  The pharmacist that spoke with me during my hospital stay was:  [facility]  Why was Coumadin prescribed for you? Coumadin was prescribed for you because you have a blood clot or a medical condition that can cause an increased risk of forming blood clots. Blood clots can cause serious health problems by blocking the flow of blood to the heart, lung, or brain. Coumadin can prevent harmful blood clots from forming. As a reminder your indication for Coumadin is:   Pulmonary Embolism Treatment  What test will check on my response to Coumadin? While on Coumadin (warfarin) you will need to have an INR test regularly to ensure that your dose is keeping you in the desired range. The INR (international normalized ratio) number is calculated from the result of the laboratory test called prothrombin time (PT).  If an INR APPOINTMENT HAS NOT ALREADY BEEN MADE FOR YOU please schedule an appointment to have this lab work done by your health care provider within 7 days. Your INR goal is usually a number between:  2 to 3 or your provider may give you a more narrow range like 2-2.5.  Ask your health care provider during an office visit what your goal INR is.  What  do you need to  know  About  COUMADIN? Take Coumadin (warfarin) exactly as prescribed by your healthcare provider about the same time each day.  DO NOT stop taking without talking to the doctor who prescribed the medication.  Stopping without other blood clot prevention medication to take the place of Coumadin may increase your risk of developing a new clot or stroke.  Get refills before you run out.  What do you do if you miss a dose? If you miss a dose, take it as soon as you remember on the same day then continue your regularly scheduled regimen the next day.  Do not take two  doses of Coumadin at the same time.  Important Safety Information A possible side effect of Coumadin (Warfarin) is an increased risk of bleeding. You should call your healthcare provider right away if you experience any of the following: ? Bleeding from an injury or your nose that does not stop. ? Unusual colored urine (red or dark brown) or unusual colored stools (red or black). ? Unusual bruising for unknown reasons. ? A serious fall or if you hit your head (even if there is no bleeding).  Some foods or medicines interact with Coumadin (warfarin) and might alter your response to warfarin. To help avoid this: ? Eat a balanced diet, maintaining a consistent amount of Vitamin K. ? Notify your provider about major diet changes you plan to make. ? Avoid alcohol or limit your intake to 1 drink for women and 2 drinks for men per day. (1 drink is 5 oz. wine, 12 oz. beer, or 1.5 oz. liquor.)  Make sure that ANY health care provider who prescribes medication for you knows that you are taking Coumadin (warfarin).  Also make sure the healthcare provider who is monitoring your Coumadin knows when you have started a new medication including herbals and non-prescription products.  Coumadin (Warfarin)  Major Drug Interactions  Increased Warfarin Effect Decreased Warfarin Effect  Alcohol (large quantities) Antibiotics (esp. Septra/Bactrim, Flagyl, Cipro) Amiodarone (Cordarone) Aspirin (ASA) Cimetidine (Tagamet) Megestrol (Megace) NSAIDs (ibuprofen, naproxen, etc.) Piroxicam (  Feldene) °Propafenone (Rythmol SR) °Propranolol (Inderal) °Isoniazid (INH) °Posaconazole (Noxafil) Barbiturates (Phenobarbital) °Carbamazepine (Tegretol) °Chlordiazepoxide (Librium) °Cholestyramine (Questran) °Griseofulvin °Oral Contraceptives °Rifampin °Sucralfate (Carafate) °Vitamin K  ° °Coumadin® (Warfarin) Major Herbal Interactions  °Increased Warfarin Effect Decreased Warfarin Effect  °Garlic °Ginseng °Ginkgo biloba Coenzyme  Q10 °Green tea °St. John’s wort   ° °Coumadin® (Warfarin) FOOD Interactions  °Eat a consistent number of servings per week of foods HIGH in Vitamin K °(1 serving = ½ cup)  °Collards (cooked, or boiled & drained) °Kale (cooked, or boiled & drained) °Mustard greens (cooked, or boiled & drained) °Parsley *serving size only = ¼ cup °Spinach (cooked, or boiled & drained) °Swiss chard (cooked, or boiled & drained) °Turnip greens (cooked, or boiled & drained)  °Eat a consistent number of servings per week of foods MEDIUM-HIGH in Vitamin K °(1 serving = 1 cup)  °Asparagus (cooked, or boiled & drained) °Broccoli (cooked, boiled & drained, or raw & chopped) °Brussel sprouts (cooked, or boiled & drained) *serving size only = ½ cup °Lettuce, raw (green leaf, endive, romaine) °Spinach, raw °Turnip greens, raw & chopped  ° °These websites have more information on Coumadin (warfarin):  www.coumadin.com; °www.ahrq.gov/consumer/coumadin.htm; ° ° °

## 2014-05-12 NOTE — Progress Notes (Signed)
[facility] received phone call from [facility] charge [employer] requesting assistance with oxygen.  Patient wears oxygen at home in [place].  Patient's oxygen supplier in [place] is [place] [phone].  Per unit CM note, Counsellor provider for oxygen tanks to travel home with.  EDCM called [facility] to assess status on oxygen tank delivery, however, office is closed.

## 2014-05-12 NOTE — Progress Notes (Signed)
SATURATION QUALIFICATIONS: (This note is used to comply with regulatory documentation for home oxygen)  Patient Saturations on Room Air at Rest = 85%  Patient Saturations on Room Air while Ambulating = 82%  Patient Saturations on 2 Liters of oxygen while Ambulating = 91%  Please briefly explain why patient needs home oxygen:

## 2014-05-12 NOTE — Progress Notes (Signed)
Pt is on home O2 traveled from [place] to [place], [place] without O2.  Pt states that her O2 is from [place] in [place].  A call was made to [employer]  [phone] for information on getting the pt O2 to travel with. [name] traveled from [place] with concentrator and no portable O2 tank. Basilio Cairo will call back.  [facility] in house rep called for quote on purchasing O2 tanks. [facility] rep called [facility]., their rep states that they are working on with a National provider to get O2 tanks for pt to travel home. Spoke with pt back and forth concerning this situation.

## 2014-05-12 NOTE — Progress Notes (Signed)
[place] for Lovenox, Warfarin Indication: pulmonary embolus  No Known Allergies  Patient Measurements: Height: 5\' 2"  (157.5 cm) Weight: 252 lb (114.306 kg) IBW/kg (Calculated) : 50.1  Vital Signs:    Labs:  Recent Labs  05/09/14 1900  05/10/14 0510 05/11/14 0436 05/12/14 0517  HGB  --   < > 10.8* 11.3* 11.8*  HCT  --   --  34.3* 35.7* 37.2  PLT  --   --  120* 114* 122*  LABPROT  --   --  15.3*  --  15.3*  INR  --   --  1.20  --  1.20  HEPARINUNFRC 0.20*  --  0.51  --   --   CREATININE  --   --  1.00  --   --   < > = values in this interval not displayed.  Estimated Creatinine Clearance: 61.7 mL/min (by C-G formula based on Cr of 1).  Medications:  Scheduled:  . azithromycin  500 mg Oral Q24H  . cycloSPORINE  1 drop Both Eyes BID  . enoxaparin (LOVENOX) injection  100 mg Subcutaneous BID  . hydroxychloroquine  200 mg Oral Daily  . mometasone-formoterol  2 puff Inhalation BID  . multivitamin with minerals  1 tablet Oral Daily  . pantoprazole  40 mg Oral Daily  . pregabalin  300 mg Oral QHS  . tiotropium  18 mcg Inhalation Daily  . Warfarin - Pharmacist Dosing Inpatient   Does not apply q1800   Assessment: 69 yr female travelling via train and developed chest pain, lethargy.  H/O RLL lobectomy and previous hx of DVT on chronic anti-coagulation (Xarelto) stopped several months ago for vaginal bleeding s/p D&C.  CTAngio shows + left upper lobe and right lower lung PE.  Pharmacy initially consulted to dose IV Heparin/warfarin for +PE-Heparin begun 3/26, received warfarin 3/27, then transitioned to Lovenox 3/28.  Warfarin restarted on 3/29.  Today, 05/12/2014:  INR 1.2  Hgb improved to 11.8, Pltc 122  No bleeding issues noted per nursing  Drug interactions with warfarin: broad spectrum antibiotcs may reduce warfarin needs  Goal of Therapy:  Anti-Xa level: 0.6-1 units/mL 4 hours after Lovenox dose given Monitor platelets by  anticoagulation protocol: Yes   Plan:   Continue Lovenox 100mg  SQ q12h.  Warfarin 7.5 mg PO x 1 today.  Daily PT/INR  Patient will need 5 days of overlap with Lovenox/Warfarin and INR > 2 for at least 24 hours prior to discontinuation of Lovenox.   Gretta Arab PharmD, BCPS Pager [phone] 05/12/2014 5:46 PM

## 2014-05-12 NOTE — Discharge Summary (Signed)
Physician Discharge Summary  Erin Park MVE:[id number] DOB: 21-May-1945 DOA: 05/08/2014  PCP: No primary care provider on file.  Admit date: 05/08/2014 Discharge date: 05/12/2014  Time spent: greater than 30 minutes  Recommendations for Outpatient Follow-up:  1. Monitor INR 2. 5 day total lovenox coumadin bridge  Discharge Diagnoses:  Active Problems:   CAP (community acquired pneumonia)   Pulmonary embolism, recurrent   Acute on chronic respiratory failure with hypoxia   Left leg DVT  Discharge Condition: stable  Diet recommendation: warfarin compatible  Filed Weights   05/08/14 2303 05/09/14 0134  Weight: 111.131 kg (245 lb) 114.306 kg (252 lb)    History of present illness:  69 y.o. female with h/o lung CA in past s/p RLL lobectomy, h/o DVT had been on anticoagulation that was stopped several months ago due to vaginal bleeding (was worked up by OB/GYN, not endometrial carcinoma patient states, had D&C).  Patient presents to ED after developing R sided chest pain, SOB. Symptoms have been intermittent since 6:30 AM today. She also has LLE leg and calf pain and a new "knot" on her LLE shin. All of this occurs in the context of prolonged immobility (train ride from [place] to [place] today). She has also had nausea, 2 episodes of vomiting, felt febrile but never took temperature. Not really much of a cough she states.  Hospital Course:  Bilateral acute pulmonary embolism LUL and RLL - Patient admitted to telemetry: Sinus rhythm - Started on IV heparin drip initially and switched to Lovenox bridging on 3/28. - Bilateral lower extremity venous Dopplers are negative for DVT. - Patient had one episode of trace blood in sputum but no other overt or significant bleeding. No further hemoptysis reported. - Discussed oral anticoagulants 3/27 including Coumadin and NOAC's including risks, benefits and side effects. Patient opted to proceed with Coumadin. - Platelet count had dropped from  153 > 108. But has improved to 120. States that she may have been told to have low platelets in the past. - Still hypoxic but O2 requirement down to 2 l [place], which is what pt on at home - Missed Coumadin on 3/28 and hence day 2/5 bridging today. - Will need close OP follow up re Coumadin dose Mx and DC Lovenox when appropriate. - Has FU at [facility] on 3[facility] and further checks probably in [place]. Pt adamant about leaving today  Possible community-acquired pneumonia - Continue IV Rocephin and azithromycin - repeat CXR 3/28 shows bibasal opacities like prior CT. - Sputum culture: Beta Lactamase + Moraxella. D/c'd rocephin. Will complete several more days azithromycin  Acute on chronic hypoxic respiratory failure - Precipitated by bilateral acute PE and possible CAP. - Continue oxygen supplementation - On 2 L home O2  - Aggressive incentive spirometry.  Acute on chronic kidney disease - Likely secondary to GI losses, poor oral intake and dehydration - Acute renal failure resolved after gentle IV fluid hydration. Hold diuretics for a week post discharge  Thrombocytopenia - Unsure etiology. - Improved/stable.? Chronic.  Nausea, vomiting and diarrhea - Self-limited. Resolved - Total bilirubin is elevated but other LFTs are unremarkable  History of colon cancer and lung cancer status post surgery - Said to be in remission.  Anemia - ? Dilutional. No reported bleeding. - Follow CBCs- stable.  Consultants:  None  Procedures:  None   Discharge Exam: Filed Vitals:   05/12/14 0435  BP: 108/60  Pulse: 79  Temp: 97.8 F (36.6 C)  Resp: 20  General: comfortable Cardiovascular: RRR Respiratory: CTA  Discharge Instructions   Discharge Instructions    Diet - low sodium heart healthy    Complete by:  As directed      Discharge instructions    Complete by:  As directed   HOLD SPIRONOLACTONE/HYDROCHLOROTHIAZIDE AND HOLD DILTIAZEM FOR ONE WEEK THEN RESUME     Increase  activity slowly    Complete by:  As directed           Current Discharge Medication List    START taking these medications   Details  azithromycin (ZITHROMAX) 500 MG tablet Take 1 tablet (500 mg total) by mouth daily. Qty: 3 tablet, Refills: 0    enoxaparin (LOVENOX) 100 MG/ML injection Inject 1 mL (100 mg total) into the skin 2 (two) times daily. Qty: 7 Syringe, Refills: 0    warfarin (COUMADIN) 5 MG tablet Take 1 tablet (5 mg total) by mouth daily. Or as instructed. Qty: 30 tablet, Refills: 0      CONTINUE these medications which have CHANGED   Details  diltiazem (DILACOR XR) 120 MG 24 hr capsule Take 1 capsule (120 mg total) by mouth daily. Hold for 1 week then resume    spironolactone-hydrochlorothiazide (ALDACTAZIDE) 25-25 MG per tablet Take 1 tablet by mouth daily. Hold for one week then resume      CONTINUE these medications which have NOT CHANGED   Details  acetaminophen (TYLENOL) 500 MG tablet Take 1,000 mg by mouth every 6 (six) hours as needed for moderate pain.    acetaminophen-codeine (TYLENOL #4) 300-60 MG per tablet Take 1 tablet by mouth every 4 (four) hours as needed for moderate pain.    albuterol (PROVENTIL HFA;VENTOLIN HFA) 108 (90 BASE) MCG/ACT inhaler Inhale 2 puffs into the lungs every 6 (six) hours as needed for wheezing or shortness of breath.    cycloSPORINE (RESTASIS) 0.05 % ophthalmic emulsion Place 1 drop into both eyes 2 (two) times daily.    Fluticasone-Salmeterol (ADVAIR) 250-50 MCG/DOSE AEPB Inhale 1 puff into the lungs 2 (two) times daily.    hydroxychloroquine (PLAQUENIL) 200 MG tablet Take 200 mg by mouth daily.    metoprolol tartrate (LOPRESSOR) 25 MG tablet Take 25 mg by mouth daily.    Multiple Vitamins-Minerals (MULTIVITAMINS THER. W/MINERALS) TABS tablet Take 1 tablet by mouth daily.    omeprazole (PRILOSEC) 20 MG capsule Take 20 mg by mouth daily.    pregabalin (LYRICA) 150 MG capsule Take 300 mg by mouth at bedtime.     simethicone (MYLICON) 371 MG chewable tablet Chew 250 mg by mouth every 6 (six) hours as needed for flatulence.    tiotropium (SPIRIVA) 18 MCG inhalation capsule Place 18 mcg into inhaler and inhale daily.       No Known Allergies Follow-up Information    Follow up with [facility]     On 05/13/2014.   Why:  please take photo ID, discharge papers, insurance card and co-pay. Appointment at 10 AM & 1030 AM for labs.  Please arrive at 945 AM.    Contact information:   [address] [place] [address] [phone]      Follow up with [facility].   Why:  NEXT WEEK TO CHECK BLOOD PRESSURE AND COUMADIN/INR       The results of significant diagnostics from this hospitalization (including imaging, microbiology, ancillary and laboratory) are listed below for reference.    Significant Diagnostic Studies: Ct Angio Chest  Pe W/cm &/or Wo Cm  05/08/2014   CLINICAL DATA:  Right side chest pain radiating to back. History of right lobectomy in 2009.  EXAM: CT ANGIOGRAPHY CHEST WITH CONTRAST  TECHNIQUE: Multidetector CT imaging of the chest was performed using the standard protocol during bolus administration of intravenous contrast. Multiplanar CT image reconstructions and MIPs were obtained to evaluate the vascular anatomy.  CONTRAST:  137mL OMNIPAQUE IOHEXOL 350 MG/ML SOLN  COMPARISON:  None.  FINDINGS: Filling defects are noted within left upper lobe pulmonary arteries and right lower lung compatible with pulmonary emboli. Heart is normal size. Aorta is normal caliber. No mediastinal, hilar, or axillary adenopathy.  Bilateral airspace opacities are noted, most confluent in the bases, also noted in the right middle lobe. Prior postoperative changes from right lower lobectomy. No pleural effusions.  Chest wall soft tissues are unremarkable. Imaging into the upper abdomen shows no acute findings.  Review of the MIP images confirms the above  findings.  IMPRESSION: Left upper lobe and right lower lung pulmonary emboli.  Bilateral airspace opacities, most confluent in the bases. Cannot exclude pneumonia.  Critical Value/emergent results were called by telephone at the time of interpretation on 05/08/2014 at 10:51 pm to Dr. Hyman Bible , who verbally acknowledged these results.   Electronically Signed   By: Rolm Baptise M.D.   On: 05/08/2014 22:53   Dg Chest Port 1 View  05/10/2014   CLINICAL DATA:  Hypoxia. History of right lower lobectomy for lung cancer.  EXAM: PORTABLE CHEST - 1 VIEW  COMPARISON:  CTA chest 05/08/2014  FINDINGS: The patient is rotated to the right, limiting evaluation of the mediastinum. Surgical clips and staple lines are noted in the right lung from prior lobectomy. Bibasilar lung opacities are similar in extends to the recent CT. No definite pleural effusion or pneumothorax is identified.  IMPRESSION: Bibasilar lung opacities, similar to the recent CT and concerning for pneumonia.   Electronically Signed   By: Logan Bores   On: 05/10/2014 08:02    Microbiology: Recent Results (from the past 240 hour(s))  Blood culture (routine x 2)     Status: None (Preliminary result)   Collection Time: 05/08/14 11:35 PM  Result Value Ref Range Status   Specimen Description BLOOD R UPPER ARM  Final   Special Requests BOTTLES DRAWN AEROBIC AND ANAEROBIC 5ML  Final   Culture   Final           BLOOD CULTURE RECEIVED NO GROWTH TO DATE CULTURE WILL BE HELD FOR 5 DAYS BEFORE ISSUING A FINAL NEGATIVE REPORT Performed at [facility]    Report Status PENDING  Incomplete  Blood culture (routine x 2)     Status: None (Preliminary result)   Collection Time: 05/08/14 11:40 PM  Result Value Ref Range Status   Specimen Description BLOOD L ARM  Final   Special Requests BOTTLES DRAWN AEROBIC AND ANAEROBIC 3ML  Final   Culture   Final           BLOOD CULTURE RECEIVED NO GROWTH TO DATE CULTURE WILL BE HELD FOR 5 DAYS BEFORE  ISSUING A FINAL NEGATIVE REPORT Performed at [facility]    Report Status PENDING  Incomplete  Culture, sputum-assessment     Status: None   Collection Time: 05/09/14  1:58 AM  Result Value Ref Range Status   Specimen Description SPUTUM  Final   Special Requests NONE  Final   Sputum evaluation   Final  THIS SPECIMEN IS ACCEPTABLE. RESPIRATORY CULTURE REPORT TO FOLLOW.   Report Status 05/09/2014 FINAL  Final  Culture, respiratory (NON-Expectorated)     Status: None   Collection Time: 05/09/14  1:58 AM  Result Value Ref Range Status   Specimen Description SPU  Final   Special Requests NONE  Final   Gram Stain   Final    ABUNDANT WBC PRESENT, PREDOMINANTLY PMN RARE SQUAMOUS EPITHELIAL CELLS PRESENT ABUNDANT GRAM NEGATIVE COCCI ABUNDANT GRAM POSITIVE COCCI IN PAIRS IN CLUSTERS    Culture   Final    ABUNDANT MORAXELLA CATARRHALIS(BRANHAMELLA) Note: BETA LACTAMASE POSITIVE Performed at [facility]    Report Status 05/11/2014 FINAL  Final     Labs: Basic Metabolic Panel:  Recent Labs Lab 05/08/14 2033 05/09/14 0532 05/10/14 0510  NA 139 140 140  K 4.4 4.3 3.8  CL 104 104 107  CO2 21 23 24   GLUCOSE 104* 93 117*  BUN 30* 26* 15  CREATININE 1.57* 1.32* 1.00  CALCIUM 9.2 8.8 8.3*   Liver Function Tests:  Recent Labs Lab 05/08/14 2033 05/10/14 1150  AST 44* 34  ALT 31 24  ALKPHOS 81 120*  BILITOT 2.5* 0.9  PROT 7.4 7.5  ALBUMIN 3.5 3.4*   No results for input(s): LIPASE, AMYLASE in the last 168 hours. No results for input(s): AMMONIA in the last 168 hours. CBC:  Recent Labs Lab 05/08/14 2033 05/09/14 0532 05/10/14 0510 05/11/14 0436 05/12/14 0517  WBC 18.0* 17.7* 15.8* 14.7* 9.7  NEUTROABS 15.9*  --   --   --   --   HGB 13.5 12.9 10.8* 11.3* 11.8*  HCT 42.0 39.6 34.3* 35.7* 37.2  MCV 74.1* 73.7* 74.7* 73.9* 74.1*  PLT 153 108* 120* 114* 122*   Cardiac Enzymes: No results for input(s): CKTOTAL, CKMB, CKMBINDEX, TROPONINI in the  last 168 hours. BNP: BNP (last 3 results)  Recent Labs  05/08/14 2033  BNP 101.7*    ProBNP (last 3 results) No results for input(s): PROBNP in the last 8760 hours.  CBG: No results for input(s): GLUCAP in the last 168 hours.     SignedDelfina Redwood  [facility] 05/12/2014, 10:37 AM

## 2014-05-13 ENCOUNTER — Inpatient Hospital Stay: Payer: Medicare Other | Admitting: Family Medicine

## 2014-05-13 LAB — CBC
HCT: 37.1 % (ref 36.0–46.0)
Hemoglobin: 12.2 g/dL (ref 12.0–15.0)
MCH: 23.9 pg — ABNORMAL LOW (ref 26.0–34.0)
MCHC: 32.9 g/dL (ref 30.0–36.0)
MCV: 72.7 fL — AB (ref 78.0–100.0)
Platelets: 129 10*3/uL — ABNORMAL LOW (ref 150–400)
RBC: 5.1 MIL/uL (ref 3.87–5.11)
RDW: 15.7 % — ABNORMAL HIGH (ref 11.5–15.5)
WBC: 9.8 10*3/uL (ref 4.0–10.5)

## 2014-05-13 LAB — PROTIME-INR
INR: 1.44 (ref 0.00–1.49)
Prothrombin Time: 17.7 seconds — ABNORMAL HIGH (ref 11.6–15.2)

## 2014-05-13 LAB — HEPARIN ANTI-XA: Heparin LMW: 0.99 IU/mL

## 2014-05-13 MED ORDER — WARFARIN SODIUM 5 MG PO TABS
7.5000 mg | ORAL_TABLET | Freq: Once | ORAL | Status: AC
  Administered 2014-05-13: 7.5 mg via ORAL
  Filled 2014-05-13 (×2): qty 1

## 2014-05-13 NOTE — Progress Notes (Signed)
Pt will discharge in am related to INR 1.44.  Pt will also purchase portable home O2 tank from [facility].

## 2014-05-13 NOTE — Progress Notes (Signed)
05/13/14 at 1300.  [place] supplies from [place]. [phone] called concerning pt's home [place].  05/13/14 at 9 AM. Spoke with [employer] agent [place] [phone] today concerning pt's home O2. Explained that the pt needed O2 to travel from the hospital to her sisters home. [place] states she will return my call.

## 2014-05-13 NOTE — Progress Notes (Addendum)
[place] for Lovenox, Warfarin Indication: pulmonary embolus  No Known Allergies  Patient Measurements: Height: 5\' 2"  (157.5 cm) Weight: 252 lb (114.306 kg) IBW/kg (Calculated) : 50.1  Vital Signs: Temp: 99 F (37.2 C) (03/31 0645) Temp Source: Oral (03/31 0645) BP: 116/47 mmHg (03/31 0645) Pulse Rate: 88 (03/31 0645)  Labs:  Recent Labs  05/11/14 0436 05/12/14 0517 05/13/14 0450 05/13/14 0555  HGB 11.3* 11.8* 12.2  --   HCT 35.7* 37.2 37.1  --   PLT 114* 122* 129*  --   LABPROT  --  15.3*  --  17.7*  INR  --  1.20  --  1.44    Estimated Creatinine Clearance: 61.7 mL/min (by C-G formula based on Cr of 1).  Medications:  Scheduled:  . azithromycin  500 mg Oral Q24H  . cycloSPORINE  1 drop Both Eyes BID  . enoxaparin (LOVENOX) injection  100 mg Subcutaneous BID  . hydroxychloroquine  200 mg Oral Daily  . mometasone-formoterol  2 puff Inhalation BID  . multivitamin with minerals  1 tablet Oral Daily  . pantoprazole  40 mg Oral Daily  . pregabalin  300 mg Oral QHS  . tiotropium  18 mcg Inhalation Daily  . warfarin  7.5 mg Oral ONCE-1800  . Warfarin - Pharmacist Dosing Inpatient   Does not apply q1800   Assessment: 69 yr [place] travelling via train and developed chest pain, lethargy.  H/O RLL lobectomy and previous hx of DVT on chronic anti-coagulation (Xarelto) stopped several months ago for vaginal bleeding s/p D&C.  CTAngio shows + left upper lobe and right lower lung PE.  Pharmacy initially consulted to dose IV Heparin/warfarin for +PE-Heparin begun 3/26, received warfarin 3/27, then transitioned to Lovenox 3/28.  Warfarin restarted on 3/29.  Today, 05/13/2014:  INR 1.44, trending up  Hgb improved to 12.2, Pltc stable at 129  No bleeding issues noted per nursing  Drug interactions with warfarin: broad spectrum antibiotcs may reduce warfarin needs  Goal of Therapy:  INR 2-3 Anti-Xa level: 0.6-1 units/mL 4 hours  after Lovenox dose given Monitor platelets by anticoagulation protocol: Yes    Plan:   Continue Lovenox 100mg  SQ q12h.  Check anti-Xa level at 1330 today (~ 4 hours after dose given this morning), as patient is obese and at risk for accumulation.  Warfarin 7.5 mg PO x 1 today.  Daily PT/INR  Patient will need 5 days of overlap with Lovenox/Warfarin and INR > 2 for at least 24 hours prior to discontinuation of Lovenox.  Monitor for signs/symptoms of bleeding.   Lindell Spar, PharmD, BCPS Pager: [phone] 05/13/2014 12:48 PM    Addendum:  4-hour peak LMWH level (anti-Xa) level returned at 0.99 units/mL-in target range. Continue current Lovenox regimen.   Lindell Spar, PharmD, BCPS Pager: [phone] 05/13/2014 2:14 PM

## 2014-05-13 NOTE — Progress Notes (Signed)
[facility] PROGRESS NOTE  Erin Park YTK:[id number] DOB: 07/18/1945 DOA: 05/08/2014 PCP: No primary care provider on file.  Assessment/Plan: 69 y/o female with PMH of Colon Ca, lung cancer s/p RLL lobectomy 2009, history of DVT (on Coumadin from 2009 until 2015) when she was switched to Xarelto but started having vaginal bleeding issues at which time all anticoagulation was discontinued, chronic respiratory failure on 2 L/m home oxygen, travelling from [place] visiting family in the [place] presented with progressive SOB, right-sided chest pain -admitted with bilateral acute PE, community-acquired pneumonia and acute on chronic respiratory failure with hypoxia.  1. Bilateral acute pulmonary embolism LUL and RLL; Started on IV heparin drip initially and switched to Lovenox bridging. Bilateral lower extremity venous Dopplers are negative for DVT.Missed Coumadin on 3/28 and hence day 3/5 bridging; Son planning to arrive 3/30 and take patient back to [place] on Sunday  -will cont lovenox+coumadin, f/u INR;  2. Possible community-acquired pneumonia; patient was on Continue IV Rocephin and azithromycin; repeat CXR 3/28 shows bibasal opacities like prior CT.; Sputum culture: Beta Lactamase + Moraxella. Urine pneumococcal antigen: Neg; Blood cultures 2: Negative to date. Currently on azithromycin PO; afebrile; leukocytosis has resolved  3. Acute on chronic hypoxic respiratory failure; Precipitated by bilateral acute PE and possible CAP.  Continue oxygen supplementation -check echo, patient clinically fluid overloaded; leg edema, BL crackles on lung exam, but no respiratory distress; will f/u echo, start diureses as needed  4. Acute on chronic kidney disease; Likely secondary to GI losses, poor oral intake and dehydration - Acute renal failure resolved after gentle IV fluid hydration. 5. Thrombocytopenia; Unsure etiology.; Improved/stable.? Chronic.  Code Status: full Family Communication: d/w  patient (indicate person spoken with, relationship, and if by phone, the number) Disposition Plan: home 24-48 hrs    Consultants:  none  Procedures:  Pend echo   Antibiotics:  IV Rocephin 3/26 >  IV azithromycin 3/26 >     (indicate start date, and stop date if known)  HPI/Subjective: Alert,oriented   Objective: Filed Vitals:   05/13/14 0645  BP: 116/47  Pulse: 88  Temp: 99 F (37.2 C)  Resp: 19   No intake or output data in the 24 hours ending 05/13/14 1141 Filed Weights   05/08/14 2303 05/09/14 0134  Weight: 111.131 kg (245 lb) 114.306 kg (252 lb)    Exam:   General:  Alert, no distress   Cardiovascular: s1,s2 rrr  Respiratory: BL  Crackles   Abdomen: soft, obese, nt   Musculoskeletal: leg edema   Data Reviewed: Basic Metabolic Panel:  Recent Labs Lab 05/08/14 2033 05/09/14 0532 05/10/14 0510  NA 139 140 140  K 4.4 4.3 3.8  CL 104 104 107  CO2 21 23 24   GLUCOSE 104* 93 117*  BUN 30* 26* 15  CREATININE 1.57* 1.32* 1.00  CALCIUM 9.2 8.8 8.3*   Liver Function Tests:  Recent Labs Lab 05/08/14 2033 05/10/14 1150  AST 44* 34  ALT 31 24  ALKPHOS 81 120*  BILITOT 2.5* 0.9  PROT 7.4 7.5  ALBUMIN 3.5 3.4*   No results for input(s): LIPASE, AMYLASE in the last 168 hours. No results for input(s): AMMONIA in the last 168 hours. CBC:  Recent Labs Lab 05/08/14 2033 05/09/14 0532 05/10/14 0510 05/11/14 0436 05/12/14 0517 05/13/14 0450  WBC 18.0* 17.7* 15.8* 14.7* 9.7 9.8  NEUTROABS 15.9*  --   --   --   --   --   HGB 13.5 12.9 10.8* 11.3* 11.8*  12.2  HCT 42.0 39.6 34.3* 35.7* 37.2 37.1  MCV 74.1* 73.7* 74.7* 73.9* 74.1* 72.7*  PLT 153 108* 120* 114* 122* 129*   Cardiac Enzymes: No results for input(s): CKTOTAL, CKMB, CKMBINDEX, TROPONINI in the last 168 hours. BNP (last 3 results)  Recent Labs  05/08/14 2033  BNP 101.7*    ProBNP (last 3 results) No results for input(s): PROBNP in the last 8760 hours.  CBG: No  results for input(s): GLUCAP in the last 168 hours.  Recent Results (from the past 240 hour(s))  Blood culture (routine x 2)     Status: None (Preliminary result)   Collection Time: 05/08/14 11:35 PM  Result Value Ref Range Status   Specimen Description BLOOD R UPPER ARM  Final   Special Requests BOTTLES DRAWN AEROBIC AND ANAEROBIC 5ML  Final   Culture   Final           BLOOD CULTURE RECEIVED NO GROWTH TO DATE CULTURE WILL BE HELD FOR 5 DAYS BEFORE ISSUING A FINAL NEGATIVE REPORT Performed at [facility]    Report Status PENDING  Incomplete  Blood culture (routine x 2)     Status: None (Preliminary result)   Collection Time: 05/08/14 11:40 PM  Result Value Ref Range Status   Specimen Description BLOOD L ARM  Final   Special Requests BOTTLES DRAWN AEROBIC AND ANAEROBIC 3ML  Final   Culture   Final           BLOOD CULTURE RECEIVED NO GROWTH TO DATE CULTURE WILL BE HELD FOR 5 DAYS BEFORE ISSUING A FINAL NEGATIVE REPORT Performed at [facility]    Report Status PENDING  Incomplete  Culture, sputum-assessment     Status: None   Collection Time: 05/09/14  1:58 AM  Result Value Ref Range Status   Specimen Description SPUTUM  Final   Special Requests NONE  Final   Sputum evaluation   Final    THIS SPECIMEN IS ACCEPTABLE. RESPIRATORY CULTURE REPORT TO FOLLOW.   Report Status 05/09/2014 FINAL  Final  Culture, respiratory (NON-Expectorated)     Status: None   Collection Time: 05/09/14  1:58 AM  Result Value Ref Range Status   Specimen Description SPU  Final   Special Requests NONE  Final   Gram Stain   Final    ABUNDANT WBC PRESENT, PREDOMINANTLY PMN RARE SQUAMOUS EPITHELIAL CELLS PRESENT ABUNDANT GRAM NEGATIVE COCCI ABUNDANT GRAM POSITIVE COCCI IN PAIRS IN CLUSTERS    Culture   Final    ABUNDANT MORAXELLA CATARRHALIS(BRANHAMELLA) Note: BETA LACTAMASE POSITIVE Performed at [facility]    Report Status 05/11/2014 FINAL  Final     Studies: No results  found.  Scheduled Meds: . azithromycin  500 mg Oral Q24H  . cycloSPORINE  1 drop Both Eyes BID  . enoxaparin (LOVENOX) injection  100 mg Subcutaneous BID  . hydroxychloroquine  200 mg Oral Daily  . mometasone-formoterol  2 puff Inhalation BID  . multivitamin with minerals  1 tablet Oral Daily  . pantoprazole  40 mg Oral Daily  . pregabalin  300 mg Oral QHS  . tiotropium  18 mcg Inhalation Daily  . Warfarin - Pharmacist Dosing Inpatient   Does not apply q1800   Continuous Infusions:   Active Problems:   CAP (community acquired pneumonia)   Pulmonary embolism   Acute on chronic respiratory failure with hypoxia   Left leg DVT   Leukocytosis    Time spent: >35 minutes  Kinnie Feil  [facility] Pager [phone]. If 7PM-7AM, please contact night-coverage at [email or web address], password [facility] 05/13/2014, 11:41 AM  LOS: 5 days

## 2014-05-14 DIAGNOSIS — I2699 Other pulmonary embolism without acute cor pulmonale: Secondary | ICD-10-CM

## 2014-05-14 DIAGNOSIS — J9621 Acute and chronic respiratory failure with hypoxia: Secondary | ICD-10-CM

## 2014-05-14 LAB — CBC
HCT: 32.4 % — ABNORMAL LOW (ref 36.0–46.0)
HEMOGLOBIN: 10.4 g/dL — AB (ref 12.0–15.0)
MCH: 23.4 pg — ABNORMAL LOW (ref 26.0–34.0)
MCHC: 32.1 g/dL (ref 30.0–36.0)
MCV: 72.8 fL — ABNORMAL LOW (ref 78.0–100.0)
Platelets: 163 10*3/uL (ref 150–400)
RBC: 4.45 MIL/uL (ref 3.87–5.11)
RDW: 15.8 % — ABNORMAL HIGH (ref 11.5–15.5)
WBC: 10.2 10*3/uL (ref 4.0–10.5)

## 2014-05-14 LAB — PROTIME-INR
INR: 2.04 — AB (ref 0.00–1.49)
Prothrombin Time: 23.2 seconds — ABNORMAL HIGH (ref 11.6–15.2)

## 2014-05-14 MED ORDER — WARFARIN SODIUM 5 MG PO TABS
5.0000 mg | ORAL_TABLET | Freq: Once | ORAL | Status: AC
  Administered 2014-05-14: 5 mg via ORAL
  Filled 2014-05-14: qty 1

## 2014-05-14 MED ORDER — WARFARIN SODIUM 5 MG PO TABS: 5.0000 mg | ORAL_TABLET | Freq: Once | ORAL | Status: DC

## 2014-05-14 NOTE — Progress Notes (Signed)
Echocardiogram 2D Echocardiogram has been performed.  Joelene Millin 05/14/2014, 12:49 PM

## 2014-05-14 NOTE — Discharge Summary (Signed)
Physician Discharge Summary  Erin Park OZY:[id number] DOB: Dec 13, 1945 DOA: 05/08/2014  PCP: Not in system Patient is from [place] date: 05/08/2014 Discharge date: 05/14/2014   Recommendations for Outpatient Follow-Up:   1. Patient will F/U with her PCP next week upon return home.   Discharge Diagnosis:   Principal Problem:    Pulmonary embolism Active Problems:    CAP (community acquired pneumonia)    Acute on chronic respiratory failure with hypoxia    History of Left leg DVT    Leukocytosis  Discharge Condition: Improved.  Diet recommendation:Regular.   History of Present Illness:   Erin Park is an 69 y.o. female with a PMH of colon cancer, lung cancer status post right lower lobe lobectomy 2009, history of DVT on chronic anticoagulation with Xarelto which was stopped secondary to issues with vaginal bleeding, chronic respiratory failure on 2 L of home oxygen, who was admitted 05/08/14 with progressive shortness of breath and right-sided chest pain. She was found to have acute bilateral pulmonary emboli and community-acquired pneumonia on admission. Of note, the patient is from [place] and is only visiting.  Hospital Course by Problem:   Principal Problem:  Pulmonary embolism, bilateral / history of DVT - Initially managed with heparin. Switched to Lovenox/Coumadin for bridging, currently day 4/5. INR now therapeutic. - Discharged home on an additional day of Lovenox overlap. - Bilateral lower extremity Dopplers negative for DVT. - Follow-up 2-D echocardiogram.  Active Problems:  CAP (community acquired pneumonia) with leukocytosis - Initially treated with Rocephin/azithromycin. Rocephin stopped 05/12/14. Continue azithromycin. - Sputum culture positive for beta lactamase + Moraxella. - Blood cultures negative to date. Strep pneumo/Legionella antigen negative. - Leukocytosis resolved.   Acute on chronic respiratory failure with hypoxia - Continue  supplemental oxygen. - Continue Spiriva, Dulera, and as needed bronchodilators.   Acute on chronic kidney disease - Creatinine improved.   Thrombocytopenia - May be related to Plaquenil.  Medical Consultants:    None.   Discharge Exam:   Filed Vitals:   05/14/14 0502  BP: 90/56  Pulse: 81  Temp:   Resp: 18   Filed Vitals:   05/13/14 2052 05/13/14 2158 05/14/14 0502 05/14/14 0910  BP:  93/54 90/56   Pulse:  44 81   Temp:  98.4 F (36.9 C)    TempSrc:  Oral Oral   Resp:  18 18   Height:      Weight:      SpO2: 93% 94% 88% 80%    Gen:  NAD Cardiovascular:  RRR, No M/R/G Respiratory: Lungs CTAB Gastrointestinal: Abdomen soft, NT/ND with normal active bowel sounds. Extremities: No C/E/C   The results of significant diagnostics from this hospitalization (including imaging, microbiology, ancillary and laboratory) are listed below for reference.     Procedures and Diagnostic Studies:   Ct Angio Chest Pe W/cm &/or Wo Cm 05/08/2014: Left upper lobe and right lower lung pulmonary emboli. Bilateral airspace opacities, most confluent in the bases. Cannot exclude pneumonia.  Lower extremity venous duplex bilateral 05/09/14: No evidence of DVT or superficial thrombosis involving the right lower extremity and left lower extremity.  Dg Chest Port 1 View 05/10/2014: Bibasilar lung opacities, similar to the recent CT and concerning for pneumonia.   Labs:   Basic Metabolic Panel:  Recent Labs Lab 05/08/14 2033 05/09/14 0532 05/10/14 0510  NA 139 140 140  K 4.4 4.3 3.8  CL 104 104 107  CO2 21 23 24   GLUCOSE 104* 93 117*  BUN 30* 26* 15  CREATININE 1.57* 1.32* 1.00  CALCIUM 9.2 8.8 8.3*   GFR Estimated Creatinine Clearance: 61.7 mL/min (by C-G formula based on Cr of 1). Liver Function Tests:  Recent Labs Lab 05/08/14 2033 05/10/14 1150  AST 44* 34  ALT 31 24  ALKPHOS 81 120*  BILITOT 2.5* 0.9  PROT 7.4 7.5  ALBUMIN 3.5 3.4*   Coagulation  profile  Recent Labs Lab 05/08/14 2318 05/10/14 0510 05/12/14 0517 05/13/14 0555 05/14/14 0536  INR 1.36 1.20 1.20 1.44 2.04*    CBC:  Recent Labs Lab 05/08/14 2033  05/10/14 0510 05/11/14 0436 05/12/14 0517 05/13/14 0450 05/14/14 0536  WBC 18.0*  < > 15.8* 14.7* 9.7 9.8 10.2  NEUTROABS 15.9*  --   --   --   --   --   --   HGB 13.5  < > 10.8* 11.3* 11.8* 12.2 10.4*  HCT 42.0  < > 34.3* 35.7* 37.2 37.1 32.4*  MCV 74.1*  < > 74.7* 73.9* 74.1* 72.7* 72.8*  PLT 153  < > 120* 114* 122* 129* 163  < > = values in this interval not displayed.  Anemia work up No results for input(s): VITAMINB12, FOLATE, FERRITIN, TIBC, IRON, RETICCTPCT in the last 72 hours. Microbiology Recent Results (from the past 240 hour(s))  Blood culture (routine x 2)     Status: None (Preliminary result)   Collection Time: 05/08/14 11:35 PM  Result Value Ref Range Status   Specimen Description BLOOD R UPPER ARM  Final   Special Requests BOTTLES DRAWN AEROBIC AND ANAEROBIC 5ML  Final   Culture   Final           BLOOD CULTURE RECEIVED NO GROWTH TO DATE CULTURE WILL BE HELD FOR 5 DAYS BEFORE ISSUING A FINAL NEGATIVE REPORT Performed at [facility]    Report Status PENDING  Incomplete  Blood culture (routine x 2)     Status: None (Preliminary result)   Collection Time: 05/08/14 11:40 PM  Result Value Ref Range Status   Specimen Description BLOOD L ARM  Final   Special Requests BOTTLES DRAWN AEROBIC AND ANAEROBIC 3ML  Final   Culture   Final           BLOOD CULTURE RECEIVED NO GROWTH TO DATE CULTURE WILL BE HELD FOR 5 DAYS BEFORE ISSUING A FINAL NEGATIVE REPORT Performed at [facility]    Report Status PENDING  Incomplete  Culture, sputum-assessment     Status: None   Collection Time: 05/09/14  1:58 AM  Result Value Ref Range Status   Specimen Description SPUTUM  Final   Special Requests NONE  Final   Sputum evaluation   Final    THIS SPECIMEN IS ACCEPTABLE. RESPIRATORY CULTURE  REPORT TO FOLLOW.   Report Status 05/09/2014 FINAL  Final  Culture, respiratory (NON-Expectorated)     Status: None   Collection Time: 05/09/14  1:58 AM  Result Value Ref Range Status   Specimen Description SPU  Final   Special Requests NONE  Final   Gram Stain   Final    ABUNDANT WBC PRESENT, PREDOMINANTLY PMN RARE SQUAMOUS EPITHELIAL CELLS PRESENT ABUNDANT GRAM NEGATIVE COCCI ABUNDANT GRAM POSITIVE COCCI IN PAIRS IN CLUSTERS    Culture   Final    ABUNDANT MORAXELLA CATARRHALIS(BRANHAMELLA) Note: BETA LACTAMASE POSITIVE Performed at [facility]    Report Status 05/11/2014 FINAL  Final     Discharge Instructions:   Discharge Instructions    Diet -  low sodium heart healthy    Complete by:  As directed      Discharge instructions    Complete by:  As directed   [place]     Increase activity slowly    Complete by:  As directed             Medication List    TAKE these medications        acetaminophen 500 MG tablet  Commonly known as:  TYLENOL  Take 1,000 mg by mouth every 6 (six) hours as needed for moderate pain.     acetaminophen-codeine 300-60 MG per tablet  Commonly known as:  TYLENOL #4  Take 1 tablet by mouth every 4 (four) hours as needed for moderate pain.     albuterol 108 (90 BASE) MCG/ACT inhaler  Commonly known as:  PROVENTIL HFA;VENTOLIN HFA  Inhale 2 puffs into the lungs every 6 (six) hours as needed for wheezing or shortness of breath.     azithromycin 500 MG tablet  Commonly known as:  ZITHROMAX  Take 1 tablet (500 mg total) by mouth daily.     cycloSPORINE 0.05 % ophthalmic emulsion  Commonly known as:  RESTASIS  Place 1 drop into both eyes 2 (two) times daily.     diltiazem 120 MG 24 hr capsule  Commonly known as:  DILACOR XR  Take 1 capsule (120 mg total) by mouth daily. Hold for 1 week then resume     enoxaparin 100 MG/ML injection  Commonly known as:   LOVENOX  Inject 1 mL (100 mg total) into the skin 2 (two) times daily.     Fluticasone-Salmeterol 250-50 MCG/DOSE Aepb  Commonly known as:  ADVAIR  Inhale 1 puff into the lungs 2 (two) times daily.     hydroxychloroquine 200 MG tablet  Commonly known as:  PLAQUENIL  Take 200 mg by mouth daily.     metoprolol tartrate 25 MG tablet  Commonly known as:  LOPRESSOR  Take 25 mg by mouth daily.     multivitamins ther. w/minerals Tabs tablet  Take 1 tablet by mouth daily.     omeprazole 20 MG capsule  Commonly known as:  PRILOSEC  Take 20 mg by mouth daily.     pregabalin 150 MG capsule  Commonly known as:  LYRICA  Take 300 mg by mouth at bedtime.     simethicone 125 MG chewable tablet  Commonly known as:  MYLICON  Chew 916 mg by mouth every 6 (six) hours as needed for flatulence.     spironolactone-hydrochlorothiazide 25-25 MG per tablet  Commonly known as:  ALDACTAZIDE  Take 1 tablet by mouth daily. Hold for one week then resume     tiotropium 18 MCG inhalation capsule  Commonly known as:  SPIRIVA  Place 18 mcg into inhaler and inhale daily.     warfarin 5 MG tablet  Commonly known as:  COUMADIN  Take 1 tablet (5 mg total) by mouth daily. Or as instructed.           Follow-up Information    Follow up with [facility]     On 05/13/2014.   Why:  please take photo ID, discharge papers, insurance card and co-pay. Appointment at 10 AM & 1030 AM for labs.  Please arrive at 945 AM.    Contact information:   [address] [place] [address] [phone]  Follow up with [facility].   Why:  NEXT WEEK TO CHECK BLOOD PRESSURE AND COUMADIN/INR       Time coordinating discharge: 35 minutes.  Signed:  Malaiya Paczkowski  Pager [phone] [facility] 05/14/2014, 2:37 PM

## 2014-05-14 NOTE — Progress Notes (Signed)
ANTICOAGULATION CONSULT NOTE   Pharmacy Consult for Lovenox, Warfarin Indication: pulmonary embolus  No Known Allergies  Patient Measurements: Height: 5\' 2"  (157.5 cm) Weight: 252 lb (114.306 kg) IBW/kg (Calculated) : 50.1  Vital Signs: Temp Source: Oral (04/01 0502) BP: 90/56 mmHg (04/01 0502) Pulse Rate: 81 (04/01 0502)  Labs:  Recent Labs  05/12/14 0517 05/13/14 0450 05/13/14 0555 05/14/14 0536  HGB 11.8* 12.2  --  10.4*  HCT 37.2 37.1  --  32.4*  PLT 122* 129*  --  163  LABPROT 15.3*  --  17.7* 23.2*  INR 1.20  --  1.44 2.04*    Estimated Creatinine Clearance: 61.7 mL/min (by C-G formula based on Cr of 1).  Medications:  Scheduled:  . azithromycin  500 mg Oral Q24H  . cycloSPORINE  1 drop Both Eyes BID  . enoxaparin (LOVENOX) injection  100 mg Subcutaneous BID  . hydroxychloroquine  200 mg Oral Daily  . mometasone-formoterol  2 puff Inhalation BID  . multivitamin with minerals  1 tablet Oral Daily  . pantoprazole  40 mg Oral Daily  . pregabalin  300 mg Oral QHS  . tiotropium  18 mcg Inhalation Daily  . warfarin  5 mg Oral ONCE-1800  . Warfarin - Pharmacist Dosing Inpatient   Does not apply q1800   Assessment: 69 yr female travelling via train and developed chest pain, lethargy.  H/O RLL lobectomy and previous hx of DVT on chronic anti-coagulation (Xarelto) stopped several months ago for vaginal bleeding s/p D&C.  CTAngio shows + left upper lobe and right lower lung PE.  Pharmacy initially consulted to dose IV Heparin/warfarin for +PE-Heparin begun 3/26, received warfarin 3/27, then transitioned to Lovenox 3/28.  Warfarin restarted on 3/29.  Today, 05/14/2014:  INR 2.04 (see MAR for doses administered)  Hgb trended down to 10.4, Pltc WNL  No bleeding issues noted per nursing  Drug interactions with warfarin: broad spectrum antibiotcs may reduce warfarin needs  Goal of Therapy:  INR 2-3 Anti-Xa level: 0.6-1 units/mL 4 hours after Lovenox dose  given Monitor platelets by anticoagulation protocol: Yes    Plan:   Continue Lovenox 100mg  SQ q12h, as LMWH level in target range yesterday.  Warfarin 5 mg PO x 1 today.  Daily PT/INR  Monitor for signs/symptoms of bleeding.  Patient will need 5 days of overlap with Lovenox/Warfarin and INR > 2 for at least 24 hours prior to discontinuation of Lovenox. Today is day #4/5 overlap.  Suggest discharge regimen of Warfarin 5 mg PO daily, with close INR follow-up within 2-3 days post-discharge.   Lindell Spar, PharmD, BCPS Pager: [phone] 05/14/2014 10:32 AM

## 2014-05-15 LAB — CULTURE, BLOOD (ROUTINE X 2)
CULTURE: NO GROWTH
Culture: NO GROWTH

## 2015-07-22 IMAGING — DX DG CHEST 1V PORT
1 series · 1 of 1 positions shown · non-contrast
Comparison: CTA chest 05/08/2014

CLINICAL DATA: Hypoxia. History of right lower lobectomy for lung
cancer.

EXAM:
PORTABLE CHEST - 1 VIEW

[chest ap]
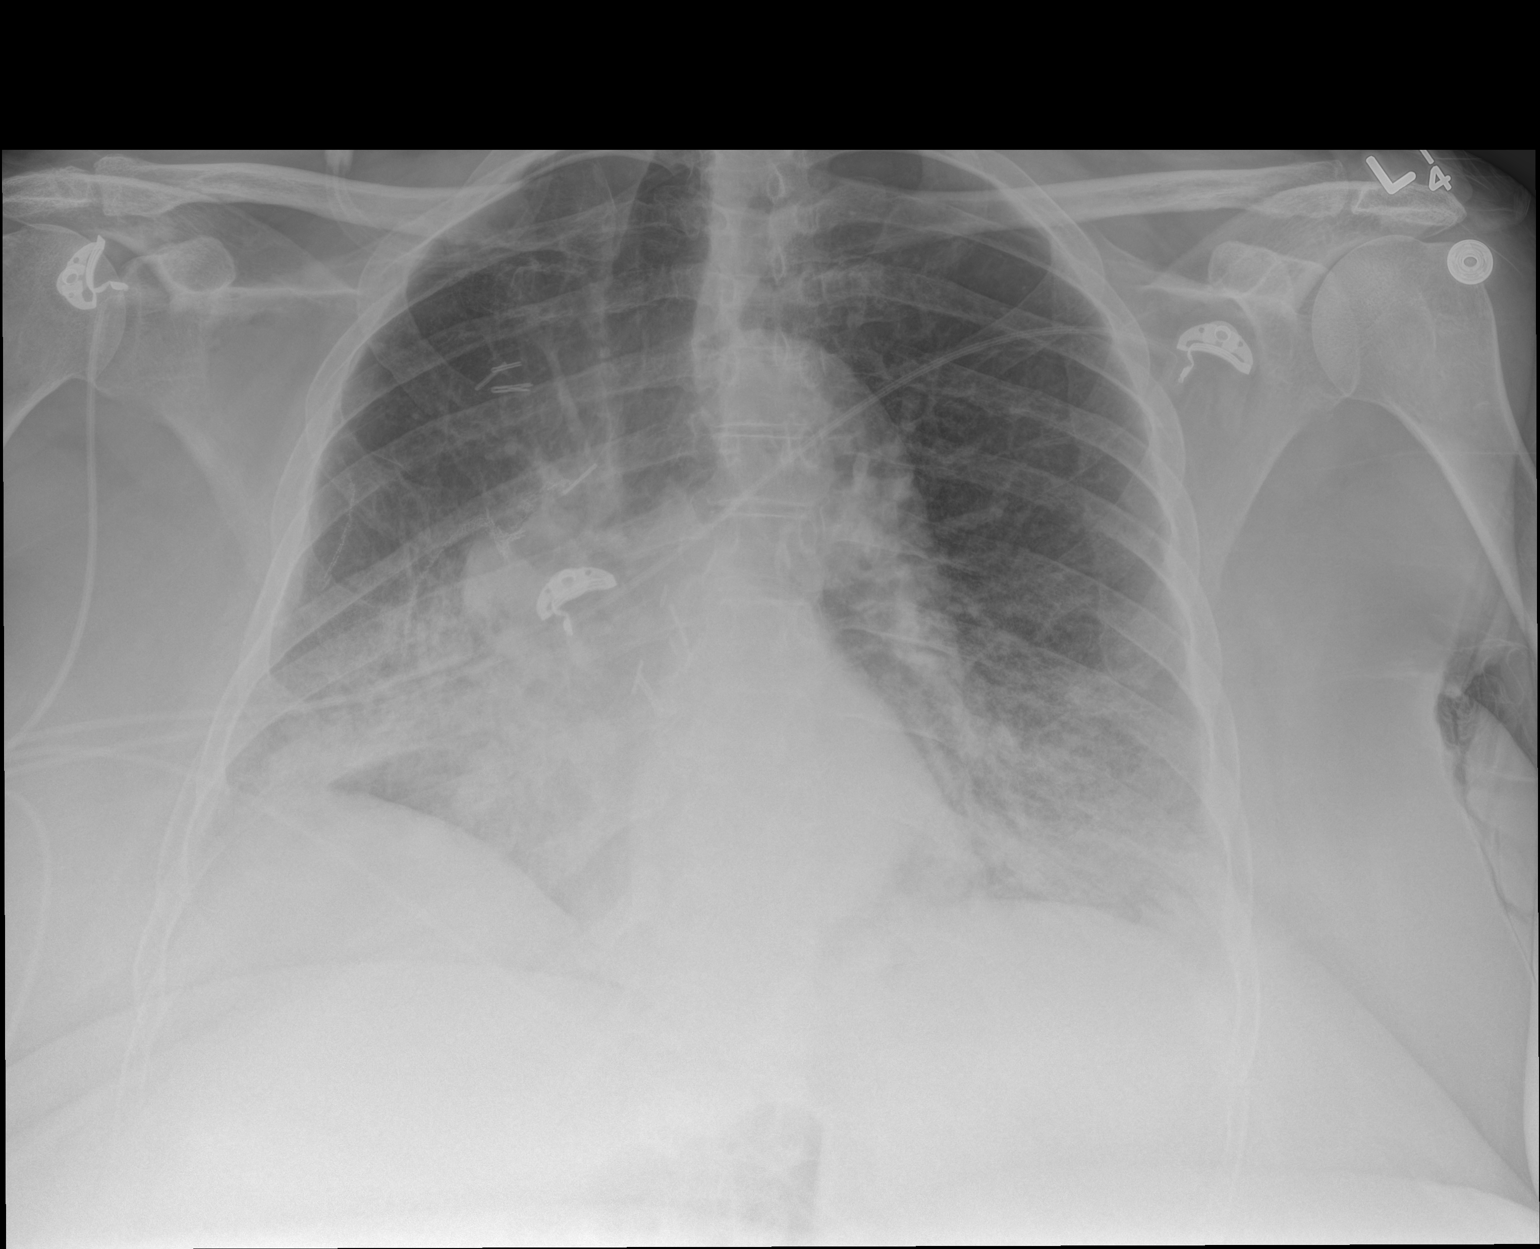

[1 of 1 positions shown; findings below may reference images not displayed]

FINDINGS: The patient is rotated to the right, limiting evaluation of the
mediastinum. Surgical clips and staple lines are noted in the right
lung from prior lobectomy. Bibasilar lung opacities are similar in
extends to the recent CT. No definite pleural effusion or
pneumothorax is identified.
IMPRESSION: Bibasilar lung opacities, similar to the recent CT and concerning
for pneumonia.

## 2018-05-14 DEATH — deceased
